# Patient Record
Sex: Female | Born: 1980 | Race: Black or African American | Hispanic: No | Marital: Single | Smoking: Never smoker
Health system: Southern US, Community
[De-identification: ages and names within clinical notes are randomized; demographics above are authoritative.]

## PROBLEM LIST (undated history)

## (undated) DIAGNOSIS — R7303 Prediabetes: Secondary | ICD-10-CM

## (undated) DIAGNOSIS — E559 Vitamin D deficiency, unspecified: Secondary | ICD-10-CM

## (undated) DIAGNOSIS — Z833 Family history of diabetes mellitus: Secondary | ICD-10-CM

## (undated) DIAGNOSIS — Z8639 Personal history of other endocrine, nutritional and metabolic disease: Secondary | ICD-10-CM

## (undated) HISTORY — DX: Personal history of other endocrine, nutritional and metabolic disease: Z86.39

## (undated) HISTORY — DX: Family history of diabetes mellitus: Z83.3

---
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
## 1998-01-11 ENCOUNTER — Other Ambulatory Visit: Admission: RE | Admit: 1998-01-11 | Discharge: 1998-01-11 | Payer: Self-pay | Admitting: Obstetrics and Gynecology

## 2011-09-13 LAB — HM PAP SMEAR: HM Pap smear: NEGATIVE

## 2012-09-17 ENCOUNTER — Encounter: Payer: Self-pay | Admitting: Nurse Practitioner

## 2012-09-18 ENCOUNTER — Ambulatory Visit: Payer: Self-pay | Admitting: Nurse Practitioner

## 2012-09-18 DIAGNOSIS — Z01419 Encounter for gynecological examination (general) (routine) without abnormal findings: Secondary | ICD-10-CM

## 2013-11-15 ENCOUNTER — Emergency Department (HOSPITAL_COMMUNITY): Payer: Managed Care, Other (non HMO)

## 2013-11-15 ENCOUNTER — Emergency Department (HOSPITAL_COMMUNITY)
Admission: EM | Admit: 2013-11-15 | Discharge: 2013-11-15 | Disposition: A | Discharge status: Home / Self Care | Payer: Managed Care, Other (non HMO) | Attending: Emergency Medicine | Admitting: Emergency Medicine

## 2013-11-15 ENCOUNTER — Encounter (HOSPITAL_COMMUNITY): Payer: Self-pay | Admitting: Emergency Medicine

## 2013-11-15 DIAGNOSIS — O9989 Other specified diseases and conditions complicating pregnancy, childbirth and the puerperium: Secondary | ICD-10-CM | POA: Insufficient documentation

## 2013-11-15 DIAGNOSIS — R109 Unspecified abdominal pain: Secondary | ICD-10-CM | POA: Insufficient documentation

## 2013-11-15 DIAGNOSIS — O039 Complete or unspecified spontaneous abortion without complication: Secondary | ICD-10-CM | POA: Insufficient documentation

## 2013-11-15 DIAGNOSIS — R000 Tachycardia, unspecified: Secondary | ICD-10-CM | POA: Insufficient documentation

## 2013-11-15 LAB — CBC
HCT: 37.7 % (ref 36.0–46.0)
Hemoglobin: 12.2 g/dL (ref 12.0–15.0)
MCH: 23.9 pg — ABNORMAL LOW (ref 26.0–34.0)
MCHC: 32.4 g/dL (ref 30.0–36.0)
MCV: 73.8 fL — AB (ref 78.0–100.0)
PLATELETS: 215 10*3/uL (ref 150–400)
RBC: 5.11 MIL/uL (ref 3.87–5.11)
RDW: 13.8 % (ref 11.5–15.5)
WBC: 7.2 10*3/uL (ref 4.0–10.5)

## 2013-11-15 LAB — BASIC METABOLIC PANEL
ANION GAP: 16 — AB (ref 5–15)
BUN: 13 mg/dL (ref 6–23)
CHLORIDE: 101 meq/L (ref 96–112)
CO2: 21 mEq/L (ref 19–32)
Calcium: 9.3 mg/dL (ref 8.4–10.5)
Creatinine, Ser: 0.73 mg/dL (ref 0.50–1.10)
GFR calc Af Amer: >90 mL/min (ref >90)
GFR calc non Af Amer: >90 mL/min (ref >90)
Glucose, Bld: 99 mg/dL (ref 70–99)
Potassium: 3.8 mEq/L (ref 3.7–5.3)
SODIUM: 138 meq/L (ref 137–147)

## 2013-11-15 LAB — ABO/RH: ABO/RH(D): O POS

## 2013-11-15 LAB — HCG, QUANTITATIVE, PREGNANCY: hCG, Beta Chain, Quant, S: 5238 m[IU]/mL — ABNORMAL HIGH (ref <5)

## 2013-11-15 NOTE — Discharge Instructions (Signed)
1. Medications: usual home medications 2. Treatment: rest, drink plenty of fluids,  3. Follow Up: Please followup with your OB/GYN for further evaluation in 1 week as discussed; return to the ED for fevers, worsening bleeding or abd pain or intractable vomiting   Miscarriage A miscarriage is the sudden loss of an unborn baby (fetus) before the 20th week of pregnancy. Most miscarriages happen in the first 3 months of pregnancy. Sometimes, it happens before a woman even knows she is pregnant. A miscarriage is also called a "spontaneous miscarriage" or "early pregnancy loss." Having a miscarriage can be an emotional experience. Talk with your caregiver about any questions you may have about miscarrying, the grieving process, and your future pregnancy plans. CAUSES   Problems with the fetal chromosomes that make it impossible for the baby to develop normally. Problems with the baby's genes or chromosomes are most often the result of errors that occur, by chance, as the embryo divides and grows. The problems are not inherited from the parents.  Infection of the cervix or uterus.   Hormone problems.   Problems with the cervix, such as having an incompetent cervix. This is when the tissue in the cervix is not strong enough to hold the pregnancy.   Problems with the uterus, such as an abnormally shaped uterus, uterine fibroids, or congenital abnormalities.   Certain medical conditions.   Smoking, drinking alcohol, or taking illegal drugs.   Trauma.  Often, the cause of a miscarriage is unknown.  SYMPTOMS   Vaginal bleeding or spotting, with or without cramps or pain.  Pain or cramping in the abdomen or lower back.  Passing fluid, tissue, or blood clots from the vagina. DIAGNOSIS  Your caregiver will perform a physical exam. You may also have an ultrasound to confirm the miscarriage. Blood or urine tests may also be ordered. TREATMENT   Sometimes, treatment is not necessary if you  naturally pass all the fetal tissue that was in the uterus. If some of the fetus or placenta remains in the body (incomplete miscarriage), tissue left behind may become infected and must be removed. Usually, a dilation and curettage (D and C) procedure is performed. During a D and C procedure, the cervix is widened (dilated) and any remaining fetal or placental tissue is gently removed from the uterus.  Antibiotic medicines are prescribed if there is an infection. Other medicines may be given to reduce the size of the uterus (contract) if there is a lot of bleeding.  If you have Rh negative blood and your baby was Rh positive, you will need a Rh immunoglobulin shot. This shot will protect any future baby from having Rh blood problems in future pregnancies. HOME CARE INSTRUCTIONS   Your caregiver may order bed rest or may allow you to continue light activity. Resume activity as directed by your caregiver.  Have someone help with home and family responsibilities during this time.   Keep track of the number of sanitary pads you use each day and how soaked (saturated) they are. Write down this information.   Do not use tampons. Do not douche or have sexual intercourse until approved by your caregiver.   Only take over-the-counter or prescription medicines for pain or discomfort as directed by your caregiver.   Do not take aspirin. Aspirin can cause bleeding.   Keep all follow-up appointments with your caregiver.   If you or your partner have problems with grieving, talk to your caregiver or seek counseling to help cope with  the pregnancy loss. Allow enough time to grieve before trying to get pregnant again.  SEEK IMMEDIATE MEDICAL CARE IF:   You have severe cramps or pain in your back or abdomen.  You have a fever.  You pass large blood clots (walnut-sized or larger) ortissue from your vagina. Save any tissue for your caregiver to inspect.   Your bleeding increases.   You have a  thick, bad-smelling vaginal discharge.  You become lightheaded, weak, or you faint.   You have chills.  MAKE SURE YOU:  Understand these instructions.  Will watch your condition.  Will get help right away if you are not doing well or get worse. Document Released: 10/24/2000 Document Revised: 08/25/2012 Document Reviewed: 06/19/2011 [facility] Patient Information 2015 [place]. This information is not intended to replace advice given to you by your health care provider. Make sure you discuss any questions you have with your health care provider.

## 2013-11-15 NOTE — ED Provider Notes (Signed)
CSN: [id number]     Arrival date & time 11/15/13  1907 History   First MD Initiated Contact with Patient 11/15/13 1936     Chief Complaint  Patient presents with  . Vaginal Bleeding     (Consider location/radiation/quality/duration/timing/severity/associated sxs/prior Treatment) Patient is a [age] y.o. female presenting with vaginal bleeding. The history is provided by the patient and medical records. No language interpreter was used.  Vaginal Bleeding Associated symptoms: abdominal pain   Associated symptoms: no back pain, no dysuria, no fatigue, no fever and no nausea     Sierra [name] is a 33 y.o. female  G3P0020 with no medical problems presents to the Emergency Department complaining of gradual, persistent, progressively worsening lower abd pain onset 6PM.  Pt reports that she took a morning after pill the last week in May, which was taken 48 hours after the sexual encounter. Pt was supposed to have a menses on May 30th.  She reports that she has had spotting for the entire month of June and did not have a full menses at the end of June.  She reports taking a home pregnancy test 3 days ago which was positive.  Pt reports that she began bleeding heavily around 6PM with the abd pain and then in the last several hours has had passage of large lots and questionable tissue.  Abd pain is a 3/10 and located in the lower quadrants. She reports that the initial pain was a 10.  No aggravating of alleviating factors.  Pt denies fever, chills, headache, neck pain, chest pain, SOB, N/V/D, weakness, dizziness, syncope.       No past medical history on file. No past surgical history on file. No family history on file. History  Substance Use Topics  . Smoking status: Not on file  . Smokeless tobacco: Not on file  . Alcohol Use: Not on file   OB History   Grav Para Term Preterm Abortions TAB SAB Ect Mult Living   1              Review of Systems  Constitutional: Negative for fever, diaphoresis,  appetite change, fatigue and unexpected weight change.  HENT: Negative for mouth sores and trouble swallowing.   Respiratory: Negative for cough, chest tightness, shortness of breath, wheezing and stridor.   Cardiovascular: Negative for chest pain and palpitations.  Gastrointestinal: Positive for abdominal pain. Negative for nausea, vomiting, diarrhea, constipation, blood in stool, abdominal distention and rectal pain.  Genitourinary: Positive for vaginal bleeding. Negative for dysuria, urgency, frequency, hematuria, flank pain and difficulty urinating.  Musculoskeletal: Negative for back pain, neck pain and neck stiffness.  Skin: Negative for rash.  Neurological: Negative for weakness.  Hematological: Negative for adenopathy.  Psychiatric/Behavioral: Negative for confusion.  All other systems reviewed and are negative.     Allergies  Review of patient's allergies indicates no known allergies.  Home Medications   Prior to Admission medications   Medication Sig Start Date End Date Taking? Authorizing Provider  Vitamin D, Ergocalciferol, (DRISDOL) 50000 UNITS CAPS capsule Take 50,000 Units by mouth every 7 (seven) days.   Yes Historical Provider, MD   BP 134/84  Pulse 102  Temp(Src) 98.4 F (36.9 C) (Oral)  Resp 18  SpO2 99% Physical Exam  Nursing note and vitals reviewed. Constitutional: She is oriented to person, place, and time. She appears well-developed and well-nourished. No distress.  Awake, alert, nontoxic appearance  HENT:  Head: Normocephalic and atraumatic.  Mouth/Throat: Oropharynx is clear and moist.  No oropharyngeal exudate.  Eyes: Conjunctivae are normal. No scleral icterus.  Neck: Normal range of motion. Neck supple.  Cardiovascular: Normal rate, regular rhythm, normal heart sounds and intact distal pulses.   No murmur heard. Mild tachycardia  Pulmonary/Chest: Effort normal and breath sounds normal. No respiratory distress. She has no wheezes.  Abdominal:  Soft. Bowel sounds are normal. She exhibits no distension and no mass. There is no hepatosplenomegaly. There is no tenderness. There is no rebound, no guarding and no CVA tenderness. Hernia confirmed negative in the right inguinal area and confirmed negative in the left inguinal area.  Abd soft and nontender  Genitourinary: Uterus normal. Pelvic exam was performed with patient supine. There is no rash, tenderness or lesion on the right labia. There is no rash, tenderness or lesion on the left labia. Uterus is not deviated, not enlarged, not fixed and not tender. Cervix exhibits no motion tenderness, no discharge and no friability. Right adnexum displays no mass, no tenderness and no fullness. Left adnexum displays no mass, no tenderness and no fullness. There is bleeding around the vagina. No erythema or tenderness around the vagina. No foreign body around the vagina. No signs of injury around the vagina. No vaginal discharge found.  Large mass of tissue in the vaginal vault removed with ring forceps; moderate bleeding from the cervical os noted  Musculoskeletal: Normal range of motion. She exhibits no edema.  Lymphadenopathy:    She has no cervical adenopathy.       Right: No inguinal adenopathy present.       Left: No inguinal adenopathy present.  Neurological: She is alert and oriented to person, place, and time.  Speech is clear and goal oriented Moves extremities without ataxia  Skin: Skin is warm and dry. No rash noted. She is not diaphoretic. No erythema.  Psychiatric: She has a normal mood and affect.    ED Course  Procedures (including critical care time) Labs Review Labs Reviewed  CBC - Abnormal; Notable for the following:    MCV 73.8 (*)    MCH 23.9 (*)    All other components within normal limits  BASIC METABOLIC PANEL - Abnormal; Notable for the following:    Anion gap 16 (*)    All other components within normal limits  HCG, QUANTITATIVE, PREGNANCY - Abnormal; Notable for the  following:    hCG, Beta Chain, Quant, S 5238 (*)    All other components within normal limits  POC URINE PREG, ED  ABO/RH    Imaging Review US Ob Comp Less 14 Wks  11/15/2013   CLINICAL DATA:  Vaginal bleeding.  EXAM: OBSTETRIC <14 WK [place] AND TRANSVAGINAL OB US  TECHNIQUE: Both transabdominal and transvaginal ultrasound examinations were performed for complete evaluation of the gestation as well as the maternal uterus, adnexal regions, and pelvic cul-de-sac. Transvaginal technique was performed to assess early pregnancy.  COMPARISON:  None.  FINDINGS: Intrauterine gestational sac: Absent  Yolk sac:  Not visualized  Embryo:  Not visualized  Maternal uterus/adnexae: Endometrium is thickened and heterogeneous, measuring up to 20 mm in thickness. This could represent blood products within the endometrium. No adnexal masses. Ovaries symmetric in size and echotexture. No free fluid.  IMPRESSION: No intrauterine pregnancy visualized. Differential considerations would include intrauterine pregnancy too early to visualize, spontaneous abortion, or occult ectopic pregnancy. Recommend close clinical followup and serial quantitative beta HCGs and ultrasounds.   Electronically Signed   By: Charlett Nose M.D.   On: 11/15/2013 21:29  [place] Ob Transvaginal  11/15/2013   CLINICAL DATA:  Vaginal bleeding.  EXAM: OBSTETRIC <14 WK US AND TRANSVAGINAL OB US  TECHNIQUE: Both transabdominal and transvaginal ultrasound examinations were performed for complete evaluation of the gestation as well as the maternal uterus, adnexal regions, and pelvic cul-de-sac. Transvaginal technique was performed to assess early pregnancy.  COMPARISON:  None.  FINDINGS: Intrauterine gestational sac: Absent  Yolk sac:  Not visualized  Embryo:  Not visualized  Maternal uterus/adnexae: Endometrium is thickened and heterogeneous, measuring up to 20 mm in thickness. This could represent blood products within the endometrium. No adnexal masses. Ovaries  symmetric in size and echotexture. No free fluid.  IMPRESSION: No intrauterine pregnancy visualized. Differential considerations would include intrauterine pregnancy too early to visualize, spontaneous abortion, or occult ectopic pregnancy. Recommend close clinical followup and serial quantitative beta HCGs and ultrasounds.   Electronically Signed   By: Charlett NoseKevin  Dover M.D.   On: 11/15/2013 21:29     EKG Interpretation None      MDM   Final diagnoses:  Spontaneous miscarriage   Sierra [name] presents with lower abdominal cramping, vaginal bleeding and passage of tissue from the vaginal vault onset approximately 6 PM tonight.  Patient with positive home pregnancy test. Suspect spontaneous miscarriage. Patient is alert, oriented, nontoxic and nonseptic appearing. Will obtain the labs, vitals and ultrasound to rule out ectopic pregnancy. She declines any pain medication at this time.  10:15 PM Patient pain under control. She continues to decline pain medication.  Patient without anemia. hCG greater than 5000.  Ultrasound without intrauterine pregnancy but no evidence of ectopic pregnancy. Patient without adnexal tenderness or mass on pelvic exam.  Large specimen of tissue removed from vaginal vault.    Patient without anemia and stable vital signs at this time. Catheterized urine, wet prep and GC chlamydia collected, but there was a labeling error with the lab.  Discussed with the patient reasons to look for urinary tract infection, STD and need to recollect samples however I do not believe that this will change her management tonight.  Patient declines recollection of samples and agrees to f/u with her OB/GYN.  I am comfortable without recollecting samples as she had no c/o dysuria, or vaginal discharge prior to today's episode of bleeding.  I have personally reviewed patient's vitals, nursing note and any pertinent labs or imaging.  I performed an undressed physical exam.    At this time, it has been  determined that no acute conditions requiring further emergency intervention. The patient/guardian have been advised of the diagnosis and plan. I reviewed all labs and imaging including any potential incidental findings. We have discussed signs and symptoms that warrant return to the ED, such as fevers, worsening bleeding or abd pain or intractable vomiting.  Patient/guardian has voiced understanding and agreed to follow-up with the PCP or specialist in 1 week.  Vital signs are stable at discharge. Pt initially tachycardic on exam with resolution on repeat physical exam.         Dierdre ForthHannah Amaranta Mehl, PA-C 11/15/13 2322

## 2013-11-15 NOTE — ED Notes (Signed)
Pt arrived to the Ed with a complaint of vaginal bleeding. The patient believes she has had a miscarriage.  Pt took a pregnacy test a couple of weeks ago and it was positive.  Today approx. 30 prior to arrival the patient had a bleeding episode.  Pt states she has used 2 pads so far.

## 2013-11-15 NOTE — ED Provider Notes (Signed)
Medical screening examination/treatment/procedure(s) were performed by non-physician practitioner and as supervising physician I was immediately available for consultation/collaboration.   EKG Interpretation None       Weslie Pretlow K Linker, MD 11/15/13 2331 

## 2013-11-24 ENCOUNTER — Ambulatory Visit (INDEPENDENT_AMBULATORY_CARE_PROVIDER_SITE_OTHER): Payer: Managed Care, Other (non HMO) | Admitting: Nurse Practitioner

## 2013-11-24 ENCOUNTER — Encounter: Payer: Self-pay | Admitting: Nurse Practitioner

## 2013-11-24 VITALS — BP 120/70 | HR 92 | Ht 71.25 in | Wt 258.0 lb

## 2013-11-24 DIAGNOSIS — Z01419 Encounter for gynecological examination (general) (routine) without abnormal findings: Secondary | ICD-10-CM

## 2013-11-24 DIAGNOSIS — Z0000 Encounter for general adult medical examination without abnormal findings: Secondary | ICD-10-CM

## 2013-11-24 DIAGNOSIS — Z8742 Personal history of other diseases of the female genital tract: Secondary | ICD-10-CM

## 2013-11-24 DIAGNOSIS — Z8759 Personal history of other complications of pregnancy, childbirth and the puerperium: Secondary | ICD-10-CM

## 2013-11-24 LAB — POCT URINALYSIS DIPSTICK
Bilirubin, UA: NEGATIVE
Blood, UA: NEGATIVE
Glucose, UA: NEGATIVE
Ketones, UA: NEGATIVE
Leukocytes, UA: NEGATIVE
NITRITE UA: NEGATIVE
PH UA: 7
Protein, UA: NEGATIVE
UROBILINOGEN UA: NEGATIVE

## 2013-11-24 MED ORDER — NORETHIN ACE-ETH ESTRAD-FE 1-20 MG-MCG PO TABS: 1.0000 | ORAL_TABLET | Freq: Every day | ORAL | Status: DC

## 2013-11-24 NOTE — Progress Notes (Signed)
Patient ID: Sierra [name], female   DOB: 26-Mar-1981, 33 y.o.   MRN: [id number] 33 y.o. G3P0030 Single African American Fe here for annual exam.  LMP menses in April was normal.. Then no menses in May but took morning after pill after condom failure.  Then on June 7 th started bleeding / spotting the entire month through to July. Then on July 5 th had lower abdominal cramping and passing tissue.  [facility] for [facility] miscarriage. Her labs show a serum HCG of 5238 and CBC with low HCG and MCH.  She had PUS which showed no intrauterine pregnancy or gestational sac.  She now comes to restart on OCP.and lab follow up.  She denies any further cramps.  No breast tenderness.  She has stopped spotting yesterday.  Will need repeat of labs today.   Same partner since 2008.  This is her second pregnancy - first one ended with abortion.  Patient's last menstrual period was 09/09/2013.          Sexually active: Yes.    The current method of family planning is condoms most of the time.    Exercising: No.  The patient does not participate in regular exercise at present. Smoker:  no  Health Maintenance: Pap:  09/13/11, WNL, neg HR HPV TDaP:  2008 Labs: HB:  12.9  Urine:  Negative    reports that she has never smoked. She has never used smokeless tobacco. She reports that she drinks about 1.5 ounces of alcohol per week. She reports that she does not use illicit drugs.  History reviewed. No pertinent past medical history.  History reviewed. No pertinent past surgical history.  Current Outpatient Prescriptions  Medication Sig Dispense Refill  . Vitamin D, Ergocalciferol, (DRISDOL) 50000 UNITS CAPS capsule Take 50,000 Units by mouth every 7 (seven) days.      . norethindrone-ethinyl estradiol (JUNEL FE,GILDESS FE,LOESTRIN FE) 1-20 MG-MCG tablet Take 1 tablet by mouth daily.  3 Package  3   No current facility-administered medications for this visit.    Family History  Problem Relation Age of  Onset  . Diabetes Father   . Cancer Maternal Grandmother 2266    ? related to smoking, unsure type    ROS:  Pertinent items are noted in HPI.  Otherwise, a comprehensive ROS was negative.  Exam:   BP 120/70  Pulse 92  Ht 5' 11.25" (1.81 m)  Wt 258 lb (117.028 kg)  BMI 35.72 kg/m2  LMP 09/09/2013  Breastfeeding? No Height: 5' 11.25" (181 cm)  Ht Readings from Last 3 Encounters:  11/24/13 5' 11.25" (1.81 m)    General appearance: alert, cooperative and appears stated age Head: Normocephalic, without obvious abnormality, atraumatic Neck: no adenopathy, supple, symmetrical, trachea midline and thyroid normal to inspection and palpation Lungs: clear to auscultation bilaterally Breasts: normal appearance, no masses or tenderness Heart: regular rate and rhythm Abdomen: soft, non-tender; no masses,  no organomegaly Extremities: extremities normal, atraumatic, no cyanosis or edema Skin: Skin color, texture, turgor normal. No rashes or lesions Lymph nodes: Cervical, supraclavicular, and axillary nodes normal. No abnormal inguinal nodes palpated Neurologic: Grossly normal   Pelvic: External genitalia:  no lesions              Urethra:  normal appearing urethra with no masses, tenderness or lesions              Bartholin's and Skene's: normal  Vagina: normal appearing vagina with normal color and discharge, no lesions              Cervix: anteverted cervical os is closed              Pap taken: No. Bimanual Exam:  Uterus:  normal with upper normal in size, no pain with exam.              Adnexa: no mass, fullness, tenderness               Rectovaginal: Confirms               Anus:  normal sphincter tone, no lesions  A:  Well Woman with normal exam  Miscarriage on 11/15/13 - follow up is needed  Contraceptive needs  History of Vit D deficiency     P:   Reviewed health and wellness pertinent to exam  Pap smear not taken today  Repeat serum HCG and CBC - informed that  serial HCG will need to be done until normal  RX Loestrin 1/20 Fe RX for a year - counseled on potential risk and side effects.  She will abstain from SA for at least 2 weeks and use BUM of birth control for a month.  Counseled on breast self exam, STD prevention, HIV risk factors and prevention, use and side effects of OCP's, adequate intake of calcium and vitamin D, diet and exercise return annually or prn  An After Visit Summary was printed and given to the patient.

## 2013-11-24 NOTE — Patient Instructions (Addendum)
EXERCISE AND DIET:  We recommended that you start or continue a regular exercise program for good health. Regular exercise means any activity that makes your heart beat faster and makes you sweat.  We recommend exercising at least 30 minutes per day at least 3 days a week, preferably 4 or 5.  We also recommend a diet low in fat and sugar.  Inactivity, poor dietary choices and obesity can cause diabetes, heart attack, stroke, and kidney damage, among others.    ALCOHOL AND SMOKING:  Women should limit their alcohol intake to no more than 7 drinks/beers/glasses of wine (combined, not each!) per week. Moderation of alcohol intake to this level decreases your risk of breast cancer and liver damage. And of course, no recreational drugs are part of a healthy lifestyle.  And absolutely no smoking or even second hand smoke. Most people know smoking can cause heart and lung diseases, but did you know it also contributes to weakening of your bones? Aging of your skin?  Yellowing of your teeth and nails?  CALCIUM AND VITAMIN D:  Adequate intake of calcium and Vitamin D are recommended.  The recommendations for exact amounts of these supplements seem to change often, but generally speaking 600 mg of calcium (either carbonate or citrate) and 800 units of Vitamin D per day seems prudent. Certain women may benefit from higher intake of Vitamin D.  If you are among these women, your doctor will have told you during your visit.    PAP SMEARS:  Pap smears, to check for cervical cancer or precancers,  have traditionally been done yearly, although recent scientific advances have shown that most women can have pap smears less often.  However, every woman still should have a physical exam from her gynecologist every year. It will include a breast check, inspection of the vulva and vagina to check for abnormal growths or skin changes, a visual exam of the cervix, and then an exam to evaluate the size and shape of the uterus and  ovaries.  And after 35 years of age, a rectal exam is indicated to check for rectal cancers. We will also provide age appropriate advice regarding health maintenance, like when you should have certain vaccines, screening for sexually transmitted diseases, bone density testing, colonoscopy, mammograms, etc.   MAMMOGRAMS:  All women over 40 years old should have a yearly mammogram. Many facilities now offer a "3D" mammogram, which may cost around $50 extra out of pocket. If possible,  we recommend you accept the option to have the 3D mammogram performed.  It both reduces the number of women who will be called back for extra views which then turn out to be normal, and it is better than the routine mammogram at detecting truly abnormal areas.    COLONOSCOPY:  Colonoscopy to screen for colon cancer is recommended for all women at age 50.  We know, you hate the idea of the prep.  We agree, BUT, having colon cancer and not knowing it is worse!!  Colon cancer so often starts as a polyp that can be seen and removed at colonscopy, which can quite literally save your life!  And if your first colonoscopy is normal and you have no family history of colon cancer, most women don't have to have it again for 10 years.  Once every ten years, you can do something that may end up saving your life, right?  We will be happy to help you get it scheduled when you are ready.    Be sure to check your insurance coverage so you understand how much it will cost.  It may be covered as a preventative service at no cost, but you should check your particular policy.      Miscarriage A miscarriage is the sudden loss of an unborn baby (fetus) before the 20th week of pregnancy. Most miscarriages happen in the first 3 months of pregnancy. Sometimes, it happens before a woman even knows she is pregnant. A miscarriage is also called a "spontaneous miscarriage" or "early pregnancy loss." Having a miscarriage can be an emotional experience. Talk  with your caregiver about any questions you may have about miscarrying, the grieving process, and your future pregnancy plans. CAUSES   Problems with the fetal chromosomes that make it impossible for the baby to develop normally. Problems with the baby's genes or chromosomes are most often the result of errors that occur, by chance, as the embryo divides and grows. The problems are not inherited from the parents.  Infection of the cervix or uterus.   Hormone problems.   Problems with the cervix, such as having an incompetent cervix. This is when the tissue in the cervix is not strong enough to hold the pregnancy.   Problems with the uterus, such as an abnormally shaped uterus, uterine fibroids, or congenital abnormalities.   Certain medical conditions.   Smoking, drinking alcohol, or taking illegal drugs.   Trauma.  Often, the cause of a miscarriage is unknown.  SYMPTOMS   Vaginal bleeding or spotting, with or without cramps or pain.  Pain or cramping in the abdomen or lower back.  Passing fluid, tissue, or blood clots from the vagina. DIAGNOSIS  Your caregiver will perform a physical exam. You may also have an ultrasound to confirm the miscarriage. Blood or urine tests may also be ordered. TREATMENT   Sometimes, treatment is not necessary if you naturally pass all the fetal tissue that was in the uterus. If some of the fetus or placenta remains in the body (incomplete miscarriage), tissue left behind may become infected and must be removed. Usually, a dilation and curettage (D and C) procedure is performed. During a D and C procedure, the cervix is widened (dilated) and any remaining fetal or placental tissue is gently removed from the uterus.  Antibiotic medicines are prescribed if there is an infection. Other medicines may be given to reduce the size of the uterus (contract) if there is a lot of bleeding.  If you have Rh negative blood and your baby was Rh positive, you  will need a Rh immunoglobulin shot. This shot will protect any future baby from having Rh blood problems in future pregnancies. HOME CARE INSTRUCTIONS   Your caregiver may order bed rest or may allow you to continue light activity. Resume activity as directed by your caregiver.  Have someone help with home and family responsibilities during this time.   Keep track of the number of sanitary pads you use each day and how soaked (saturated) they are. Write down this information.   Do not use tampons. Do not douche or have sexual intercourse until approved by your caregiver.   Only take over-the-counter or prescription medicines for pain or discomfort as directed by your caregiver.   Do not take aspirin. Aspirin can cause bleeding.   Keep all follow-up appointments with your caregiver.   If you or your partner have problems with grieving, talk to your caregiver or seek counseling to help cope with the pregnancy loss. Allow enough  time to grieve before trying to get pregnant again.  SEEK IMMEDIATE MEDICAL CARE IF:   You have severe cramps or pain in your back or abdomen.  You have a fever.  You pass large blood clots (walnut-sized or larger) ortissue from your vagina. Save any tissue for your caregiver to inspect.   Your bleeding increases.   You have a thick, bad-smelling vaginal discharge.  You become lightheaded, weak, or you faint.   You have chills.  MAKE SURE YOU:  Understand these instructions.  Will watch your condition.  Will get help right away if you are not doing well or get worse. Document Released: 10/24/2000 Document Revised: 08/25/2012 Document Reviewed: 06/19/2011 [facility] Patient Information 2015 [employer], [place]. This information is not intended to replace advice given to you by your health care provider. Make sure you discuss any questions you have with your health care provider.

## 2013-11-25 LAB — CBC WITH DIFFERENTIAL/PLATELET
BASOS ABS: 0 10*3/uL (ref 0.0–0.1)
BASOS PCT: 0 % (ref 0–1)
EOS ABS: 0.1 10*3/uL (ref 0.0–0.7)
Eosinophils Relative: 1 % (ref 0–5)
HCT: 37.3 % (ref 36.0–46.0)
Hemoglobin: 12.6 g/dL (ref 12.0–15.0)
Lymphocytes Relative: 57 % — ABNORMAL HIGH (ref 12–46)
Lymphs Abs: 3.9 10*3/uL (ref 0.7–4.0)
MCH: 24.1 pg — AB (ref 26.0–34.0)
MCHC: 33.8 g/dL (ref 30.0–36.0)
MCV: 71.5 fL — ABNORMAL LOW (ref 78.0–100.0)
MONOS PCT: 7 % (ref 3–12)
Monocytes Absolute: 0.5 10*3/uL (ref 0.1–1.0)
Neutro Abs: 2.4 10*3/uL (ref 1.7–7.7)
Neutrophils Relative %: 35 % — ABNORMAL LOW (ref 43–77)
Platelets: 258 10*3/uL (ref 150–400)
RBC: 5.22 MIL/uL — ABNORMAL HIGH (ref 3.87–5.11)
RDW: 14.9 % (ref 11.5–15.5)
WBC: 6.8 10*3/uL (ref 4.0–10.5)

## 2013-11-25 LAB — HCG, QUANTITATIVE, PREGNANCY: hCG, Beta Chain, Quant, S: 26 m[IU]/mL

## 2013-11-25 LAB — HEMOGLOBIN, FINGERSTICK: HEMOGLOBIN, FINGERSTICK: 12.9 g/dL (ref 12.0–16.0)

## 2013-11-26 ENCOUNTER — Other Ambulatory Visit: Payer: Self-pay

## 2013-11-30 NOTE — Progress Notes (Signed)
Note reviewed, agree with plan.  Giani Winther, MD  

## 2014-03-15 ENCOUNTER — Encounter: Payer: Self-pay | Admitting: Nurse Practitioner

## 2014-10-12 ENCOUNTER — Other Ambulatory Visit: Payer: Self-pay | Admitting: Nurse Practitioner

## 2014-10-12 MED ORDER — NORETHIN ACE-ETH ESTRAD-FE 1-20 MG-MCG PO TABS: 1.0000 | ORAL_TABLET | Freq: Every day | ORAL | Status: DC

## 2014-10-12 NOTE — Telephone Encounter (Signed)
Medication refill request: Junel  Last AEX:  11/24/13 PG Next AEX: 11/26/14 PG Last MMG (if hormonal medication request):  Refill authorized: 11/24/13 #3packs w/ 3R. Today #1pack/0R?

## 2014-10-12 NOTE — Telephone Encounter (Signed)
Patient request refill on Junel until her annual exam in July 2016.

## 2014-11-18 ENCOUNTER — Other Ambulatory Visit: Payer: Self-pay | Admitting: Nurse Practitioner

## 2014-11-18 MED ORDER — NORETHIN ACE-ETH ESTRAD-FE 1-20 MG-MCG PO TABS: 1.0000 | ORAL_TABLET | Freq: Every day | ORAL | Status: DC

## 2014-11-18 NOTE — Telephone Encounter (Signed)
Patient calling requesting a refill on her birth control. Pharmacy on file is correct. °

## 2014-11-18 NOTE — Telephone Encounter (Signed)
Medication refill request: Loestrin 1/20 mcg Last AEX:  11/24/13 with PG  Next AEX: 11/26/14 with PG Last MMG (if hormonal medication request): n/a Refill authorized: #1 pack no rfs  (routed to DL since PG is out of office today)

## 2014-11-19 MED ORDER — NORETHIN ACE-ETH ESTRAD-FE 1-20 MG-MCG PO TABS: 1.0000 | ORAL_TABLET | Freq: Every day | ORAL | Status: DC

## 2014-11-19 NOTE — Telephone Encounter (Signed)
Called patient and notified her of rx being sent in to pharmacy, patient asked to reschedule her AEX for 11/30/14. Appointment scheduled for 11/30/14 at 3:00.

## 2014-11-19 NOTE — Addendum Note (Signed)
Addended by: Lorraine LaxSHAW, Tashaya Ancrum J on: 11/19/2014 12:38 PM   Modules accepted: Orders

## 2014-11-19 NOTE — Telephone Encounter (Signed)
Received [facility] from [facility] patient needs 90 day supply due to insurance okay to send in 3 months?

## 2014-11-19 NOTE — Telephone Encounter (Signed)
Loestrin 1/20 mcg sent for 90 day supply.

## 2014-11-19 NOTE — Telephone Encounter (Signed)
Ok to fill 90 days

## 2014-11-26 ENCOUNTER — Ambulatory Visit: Payer: Managed Care, Other (non HMO) | Admitting: Nurse Practitioner

## 2014-11-30 ENCOUNTER — Encounter: Payer: Self-pay | Admitting: Nurse Practitioner

## 2014-11-30 ENCOUNTER — Ambulatory Visit (INDEPENDENT_AMBULATORY_CARE_PROVIDER_SITE_OTHER): Payer: Managed Care, Other (non HMO) | Admitting: Nurse Practitioner

## 2014-11-30 VITALS — BP 120/76 | HR 96 | Ht 71.0 in | Wt 279.0 lb

## 2014-11-30 DIAGNOSIS — Z01419 Encounter for gynecological examination (general) (routine) without abnormal findings: Secondary | ICD-10-CM

## 2014-11-30 DIAGNOSIS — R8290 Unspecified abnormal findings in urine: Secondary | ICD-10-CM | POA: Diagnosis not present

## 2014-11-30 DIAGNOSIS — Z0000 Encounter for general adult medical examination without abnormal findings: Secondary | ICD-10-CM | POA: Diagnosis not present

## 2014-11-30 MED ORDER — NORETHIN ACE-ETH ESTRAD-FE 1-20 MG-MCG PO TABS
1.0000 | ORAL_TABLET | Freq: Every day | ORAL | Status: DC
Start: 2014-11-30 — End: 2015-12-19

## 2014-11-30 NOTE — Patient Instructions (Signed)

## 2014-11-30 NOTE — Progress Notes (Signed)
Patient ID: Sierra [name], female   DOB: 12/01/1980, 34 y.o.   MRN: [id number] [age] y.o. [id number] Single African American Fe here for annual exam.  Menses usually 5 days , this last one was 3 days.  Patient's last menstrual period was 11/03/2014 (exact date).          Sexually active: Yes.    The current method of family planning is OCP (estrogen/progesterone).    Exercising:  Pt is trying to get to gym.   Smoker:  no  Health Maintenance: Pap: 09/13/11, Negative with neg HR HPV TDaP: 2008 Labs:  HB:  Declined  Urine:  Trace RBC, trace leuk's   reports that she has never smoked. She has never used smokeless tobacco. She reports that she drinks about 1.5 oz of alcohol per week. She reports that she does not use illicit drugs.  History reviewed. No pertinent past medical history.  History reviewed. No pertinent past surgical history.  Current Outpatient Prescriptions  Medication Sig Dispense Refill  . norethindrone-ethinyl estradiol (JUNEL FE,GILDESS FE,LOESTRIN FE) 1-20 MG-MCG tablet Take 1 tablet by mouth daily. 3 Package 0   No current facility-administered medications for this visit.    Family History  Problem Relation Age of Onset  . Diabetes Father   . Cancer Maternal Grandmother 2066    ? related to smoking, unsure type    ROS:  Pertinent items are noted in HPI.  Otherwise, a comprehensive ROS was negative.  Exam:   BP 120/76 mmHg  Pulse 96  Ht 5\' 11"  (1.803 m)  Wt 279 lb (126.554 kg)  BMI 38.93 kg/m2  LMP 11/03/2014 (Exact Date) Height: 5\' 11"  (180.3 cm) Ht Readings from Last 3 Encounters:  11/30/14 5\' 11"  (1.803 m)  11/24/13 5' 11.25" (1.81 m)    General appearance: alert, cooperative and appears stated age Head: Normocephalic, without obvious abnormality, atraumatic Neck: no adenopathy, supple, symmetrical, trachea midline and thyroid normal to inspection and palpation Lungs: clear to auscultation bilaterally Breasts: normal appearance, no masses or  tenderness Heart: regular rate and rhythm Abdomen: soft, non-tender; no masses,  no organomegaly Extremities: extremities normal, atraumatic, no cyanosis or edema Skin: Skin color, texture, turgor normal. No rashes or lesions Lymph nodes: Cervical, supraclavicular, and axillary nodes normal. No abnormal inguinal nodes palpated Neurologic: Grossly normal   Pelvic: External genitalia:  no lesions              Urethra:  normal appearing urethra with no masses, tenderness or lesions              Bartholin's and Skene's: normal                 Vagina: normal appearing vagina with normal color and discharge, no lesions              Cervix: anteverted              Pap taken: Yes.   Bimanual Exam:  Uterus:  normal size, contour, position, consistency, mobility, non-tender              Adnexa: no mass, fullness, tenderness               Rectovaginal: Confirms               Anus:  normal sphincter tone, no lesions  Chaperone present:  yes  A:  Well Woman with normal exam  Miscarriage on 11/15/13 Contraceptive now with OCP History of Vit D deficiency  R/O UTI -  no symptoms   P:   Reviewed health and wellness pertinent to exam  Pap smear as above  Refill on Junel Fe 1/20 for a year  Follow with urine C& S  Counseled on breast self exam, STD prevention, use and side effects of OCP's, adequate intake of calcium and vitamin D, diet and exercise return annually or prn  An After Visit Summary was printed and given to the patient.

## 2014-12-01 NOTE — Progress Notes (Signed)
Encounter reviewed by Dr. Citlalli Weikel Amundson C. Silva.  

## 2014-12-02 LAB — URINE CULTURE
Colony Count: NO GROWTH
ORGANISM ID, BACTERIA: NO GROWTH

## 2014-12-02 LAB — IPS PAP TEST WITH HPV

## 2015-10-25 ENCOUNTER — Emergency Department (HOSPITAL_COMMUNITY)
Admission: EM | Admit: 2015-10-25 | Discharge: 2015-10-25 | Discharge status: Absent without leave | Payer: Managed Care, Other (non HMO)

## 2015-10-25 ENCOUNTER — Encounter: Payer: Self-pay | Admitting: Nurse Practitioner

## 2015-10-25 ENCOUNTER — Ambulatory Visit (INDEPENDENT_AMBULATORY_CARE_PROVIDER_SITE_OTHER): Payer: Managed Care, Other (non HMO) | Admitting: Nurse Practitioner

## 2015-10-25 VITALS — BP 118/64 | HR 64 | Temp 98.6°F | Resp 18 | Ht 71.0 in | Wt 287.0 lb

## 2015-10-25 DIAGNOSIS — Z206 Contact with and (suspected) exposure to human immunodeficiency virus [HIV]: Secondary | ICD-10-CM
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
## 2015-10-27 ENCOUNTER — Encounter: Payer: Self-pay | Admitting: Nurse Practitioner

## 2015-10-29 NOTE — Progress Notes (Signed)
Encounter reviewed by Dr. Janean SarkBrook Amundson C. Silva. I do not seen an encounter that the patient was actually seen in the ER for PEP.  Her exposure was greater than 72 hours prior.  Her theoretic risk of HIV transmission is low. She needs baseline HIV testing, repeat at 6 weeks and then again at 4 months post exposure.  Condom use is appropriate at this time.  Patient will be contacted in follow up.

## 2015-10-31 ENCOUNTER — Telehealth: Payer: Self-pay | Admitting: Nurse Practitioner

## 2015-10-31 NOTE — Telephone Encounter (Signed)
Pt is called about the need to get HIV testing.  She is noted to go have gone to ED but no outcome.  She states after going to the ED, she was informed that the cost to her would be a lot of money and [facility] [address]  They did the HIV.  She is to repeat in 6 wk's, 3 month, and 6 months.

## 2015-12-19 ENCOUNTER — Ambulatory Visit (INDEPENDENT_AMBULATORY_CARE_PROVIDER_SITE_OTHER): Payer: Managed Care, Other (non HMO) | Admitting: Nurse Practitioner

## 2015-12-19 ENCOUNTER — Encounter: Payer: Self-pay | Admitting: Nurse Practitioner

## 2015-12-19 VITALS — BP 122/80 | HR 64 | Ht 71.25 in | Wt 281.0 lb

## 2015-12-19 DIAGNOSIS — Z0000 Encounter for general adult medical examination without abnormal findings: Secondary | ICD-10-CM

## 2015-12-19 DIAGNOSIS — R8290 Unspecified abnormal findings in urine: Secondary | ICD-10-CM

## 2015-12-19 DIAGNOSIS — E559 Vitamin D deficiency, unspecified: Secondary | ICD-10-CM

## 2015-12-19 DIAGNOSIS — Z01419 Encounter for gynecological examination (general) (routine) without abnormal findings: Secondary | ICD-10-CM

## 2015-12-19 LAB — POCT URINALYSIS DIPSTICK
BILIRUBIN UA: NEGATIVE
Glucose, UA: NEGATIVE
KETONES UA: NEGATIVE
LEUKOCYTES UA: NEGATIVE
Nitrite, UA: NEGATIVE
PH UA: 5
Protein, UA: NEGATIVE
Urobilinogen, UA: NEGATIVE

## 2015-12-19 LAB — CBC WITH DIFFERENTIAL/PLATELET
BASOS PCT: 0 %
Basophils Absolute: 0 cells/uL (ref 0–200)
EOS PCT: 2 %
Eosinophils Absolute: 124 cells/uL (ref 15–500)
HCT: 40.4 % (ref 35.0–45.0)
HEMOGLOBIN: 12.5 g/dL (ref 11.7–15.5)
LYMPHS ABS: 3100 {cells}/uL (ref 850–3900)
Lymphocytes Relative: 50 %
MCH: 23.1 pg — ABNORMAL LOW (ref 27.0–33.0)
MCHC: 30.9 g/dL — ABNORMAL LOW (ref 32.0–36.0)
MCV: 74.7 fL — ABNORMAL LOW (ref 80.0–100.0)
MPV: 10.7 fL (ref 7.5–12.5)
Monocytes Absolute: 558 cells/uL (ref 200–950)
Monocytes Relative: 9 %
NEUTROS ABS: 2418 {cells}/uL (ref 1500–7800)
Neutrophils Relative %: 39 %
PLATELETS: 246 10*3/uL (ref 140–400)
RBC: 5.41 MIL/uL — AB (ref 3.80–5.10)
RDW: 15.7 % — AB (ref 11.0–15.0)
WBC: 6.2 10*3/uL (ref 3.8–10.8)

## 2015-12-19 LAB — HEMOGLOBIN, FINGERSTICK: Hemoglobin, fingerstick: 12.7 g/dL (ref 12.0–16.0)

## 2015-12-19 MED ORDER — NORETHIN ACE-ETH ESTRAD-FE 1-20 MG-MCG PO TABS: 1.0000 | ORAL_TABLET | Freq: Every day | ORAL | 4 refills | Status: DC

## 2015-12-19 NOTE — Patient Instructions (Signed)
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
## 2015-12-19 NOTE — Progress Notes (Signed)
Reviewed personally.  M. Suzanne Coulton Schlink, MD.  

## 2015-12-20 LAB — COMPREHENSIVE METABOLIC PANEL
ALK PHOS: 60 U/L (ref 33–115)
ALT: 23 U/L (ref 6–29)
AST: 16 U/L (ref 10–30)
Albumin: 4.2 g/dL (ref 3.6–5.1)
BUN: 12 mg/dL (ref 7–25)
CALCIUM: 9.3 mg/dL (ref 8.6–10.2)
CO2: 21 mmol/L (ref 20–31)
Chloride: 105 mmol/L (ref 98–110)
Creat: 0.67 mg/dL (ref 0.50–1.10)
Glucose, Bld: 102 mg/dL — ABNORMAL HIGH (ref 65–99)
POTASSIUM: 4.3 mmol/L (ref 3.5–5.3)
Sodium: 138 mmol/L (ref 135–146)
TOTAL PROTEIN: 6.6 g/dL (ref 6.1–8.1)
Total Bilirubin: 0.3 mg/dL (ref 0.2–1.2)

## 2015-12-20 LAB — LIPID PANEL
CHOL/HDL RATIO: 2.3 ratio (ref <=5.0)
CHOLESTEROL: 118 mg/dL — AB (ref 125–200)
HDL: 51 mg/dL (ref >=46)
LDL CALC: 49 mg/dL (ref <130)
TRIGLYCERIDES: 88 mg/dL (ref <150)
VLDL: 18 mg/dL (ref <30)

## 2015-12-20 LAB — HEMOGLOBIN A1C
Hgb A1c MFr Bld: 5.5 % (ref <5.7)
MEAN PLASMA GLUCOSE: 111 mg/dL

## 2015-12-20 LAB — URINALYSIS, MICROSCOPIC ONLY
BACTERIA UA: NONE SEEN [HPF]
Casts: NONE SEEN [LPF]
Crystals: NONE SEEN [HPF]
RBC / HPF: NONE SEEN RBC/HPF (ref <=2)
Squamous Epithelial / LPF: NONE SEEN [HPF] (ref <=5)
WBC UA: NONE SEEN WBC/HPF (ref <=5)
Yeast: NONE SEEN [HPF]

## 2015-12-20 LAB — VITAMIN D 25 HYDROXY (VIT D DEFICIENCY, FRACTURES): Vit D, 25-Hydroxy: 11 ng/mL — ABNORMAL LOW (ref 30–100)

## 2015-12-20 LAB — HIV ANTIBODY (ROUTINE TESTING W REFLEX): HIV: NONREACTIVE

## 2015-12-20 LAB — TSH: TSH: 1.34 mIU/L

## 2015-12-21 MED ORDER — VITAMIN D (ERGOCALCIFEROL) 1.25 MG (50000 UNIT) PO CAPS: 50000.0000 [IU] | ORAL_CAPSULE | ORAL | 1 refills | Status: DC

## 2015-12-21 NOTE — Addendum Note (Signed)
Addended by: Francee PiccoloPHILLIPS, STEPHANIE C on: 12/21/2015 10:37 AM   Modules accepted: Orders

## 2016-01-16 ENCOUNTER — Other Ambulatory Visit: Payer: Self-pay | Admitting: Nurse Practitioner

## 2016-01-17 NOTE — Telephone Encounter (Signed)
Medication refill request: OCP Last AEX:  12/19/15 PG Next AEX: 12/19/16 PG Last MMG (if hormonal medication request): None Refill authorized: 12/19/15 [facility]3packs/[facility]R [facility] [place].   Patient notified Rx has been sent to pharmacy.

## 2016-01-17 NOTE — Telephone Encounter (Signed)
Patient is requesting a refill for her birth control. °

## 2016-04-23 ENCOUNTER — Other Ambulatory Visit (INDEPENDENT_AMBULATORY_CARE_PROVIDER_SITE_OTHER): Payer: Managed Care, Other (non HMO)

## 2016-04-23 DIAGNOSIS — Z0000 Encounter for general adult medical examination without abnormal findings: Secondary | ICD-10-CM

## 2016-04-23 DIAGNOSIS — E559 Vitamin D deficiency, unspecified: Secondary | ICD-10-CM

## 2016-04-23 NOTE — Addendum Note (Signed)
Addended by: Francee PiccoloPHILLIPS, Theodoro Koval C on: 04/23/2016 01:40 PM   Modules accepted: Orders

## 2016-04-24 LAB — HIV ANTIBODY (ROUTINE TESTING W REFLEX): HIV 1&2 Ab, 4th Generation: NONREACTIVE

## 2016-04-24 LAB — VITAMIN D 25 HYDROXY (VIT D DEFICIENCY, FRACTURES): VIT D 25 HYDROXY: 25 ng/mL — AB (ref 30–100)

## 2016-06-20 ENCOUNTER — Ambulatory Visit: Payer: Self-pay | Admitting: Nurse Practitioner

## 2016-08-28 ENCOUNTER — Ambulatory Visit (INDEPENDENT_AMBULATORY_CARE_PROVIDER_SITE_OTHER): Payer: 59 | Admitting: Orthopaedic Surgery

## 2016-08-28 ENCOUNTER — Ambulatory Visit (INDEPENDENT_AMBULATORY_CARE_PROVIDER_SITE_OTHER): Payer: 59

## 2016-08-28 DIAGNOSIS — M25552 Pain in left hip: Secondary | ICD-10-CM

## 2016-08-28 MED ORDER — METHOCARBAMOL 500 MG PO TABS: 500.0000 mg | ORAL_TABLET | Freq: Three times a day (TID) | ORAL | 0 refills | Status: DC | PRN

## 2016-08-28 MED ORDER — NAPROXEN 500 MG PO TABS: 500.0000 mg | ORAL_TABLET | Freq: Two times a day (BID) | ORAL | 3 refills | Status: DC | PRN

## 2016-08-28 NOTE — Progress Notes (Signed)
Office Visit Note   Patient: Sierra [name]           Date of Birth: 1980/10/20           MRN: [id number] Visit Date: 08/28/2016              Requested by: No referring provider defined for this encounter. PCP: No PCP Per Patient   Assessment & Plan: Visit Diagnoses:  1. Pain in left hip     Plan: This most likely is a muscle or tendinous issue like the iliopsoas tendon. I've told her if this happens again not to hesitate to give [place] a call. She would need to rest her hip for a day or 2 or even longer if it becomes recurrent problem. I'll send in some Robaxin and naproxen for her to take as needed when this occurs as well.  Follow-Up Instructions: Return if symptoms worsen or fail to improve.   Orders:  Orders Placed This Encounter  Procedures  . XR HIP UNILAT W OR W/O PELVIS 1V LEFT   Meds ordered this encounter  Medications  . methocarbamol (ROBAXIN) 500 MG tablet    Sig: Take 1 tablet (500 mg total) by mouth every 8 (eight) hours as needed for muscle spasms.    Dispense:  60 tablet    Refill:  0  . naproxen (NAPROSYN) 500 MG tablet    Sig: Take 1 tablet (500 mg total) by mouth 2 (two) times daily as needed.    Dispense:  60 tablet    Refill:  3      Procedures: No procedures performed   Clinical Data: No additional findings.   Subjective: No chief complaint on file. The patient is a very pleasant 36 year old comes in with acute left hip pain that occurred about 3 weeks ago. She felt like her hip "went out" she then had issues with weightbearing for just a day but then after a coworker gave her a muscle relaxant the next examination is normal. She still wanted make sure everything was doing fine and has not recurred since then. She denies any pain or groin pain today  HPI  Review of Systems She denies any headache, chest pain, shortness of breath, fever, chills, nausea, vomiting.  Objective: Vital Signs: There were no vitals taken for this visit.  Physical  Exam She is alert and oriented 3 and in no acute distress she is not walking with any significant limp Ortho Exam Her left hip is examined and has a normal exam today. She has fluid active and passive range of motion is full with no pain anywhere around the hip or back at all. Specialty Comments:  No specialty comments available.  Imaging: Xr Hip Unilat W Or W/o Pelvis 1v Left  Result Date: 08/28/2016 An AP pelvis and a lateral of her left hip show normal-appearing anatomy of the bone. The hip is well located. There is no significant arthritic changes or acute findings.    PMFS History: There are no active problems to display for this patient.  No past medical history on file.  Family History  Problem Relation Age of Onset  . Diabetes Father     type 2  . Cancer Maternal Grandmother 52    related to smoking, unsure type ? colon    No past surgical history on file. Social History   Occupational History  . Not on file.   Social History Main Topics  . Smoking status: Never Smoker  .  Smokeless tobacco: Never Used  . Alcohol use 1.5 oz/week    5 - 7 Glasses of wine per week     Comment: wine and occ mixed drink  . Drug use: No  . Sexual activity: Yes    Partners: Male    Birth control/ protection: Pill

## 2016-11-16 ENCOUNTER — Telehealth: Payer: Self-pay | Admitting: Nurse Practitioner

## 2016-11-16 NOTE — Telephone Encounter (Addendum)
Left message regarding upcoming appointment has been canceled and needs to be rescheduled., letter mailed.

## 2016-11-19 ENCOUNTER — Other Ambulatory Visit: Payer: Self-pay | Admitting: Nurse Practitioner

## 2016-11-19 NOTE — Telephone Encounter (Signed)
Medication refill request: OCP  Last AEX:  12-19-15  Next AEX: 01-02-17 Last MMG (if hormonal medication request): N/A Refill authorized: please advise  Sending to [email or web address]

## 2016-12-19 ENCOUNTER — Ambulatory Visit: Payer: Self-pay | Admitting: Nurse Practitioner

## 2017-01-02 ENCOUNTER — Encounter: Payer: Self-pay | Admitting: Obstetrics and Gynecology

## 2017-01-02 ENCOUNTER — Ambulatory Visit (INDEPENDENT_AMBULATORY_CARE_PROVIDER_SITE_OTHER): Payer: 59 | Admitting: Obstetrics and Gynecology

## 2017-01-02 VITALS — BP 118/78 | HR 88 | Resp 18 | Ht 71.0 in | Wt 265.0 lb

## 2017-01-02 DIAGNOSIS — Z01419 Encounter for gynecological examination (general) (routine) without abnormal findings: Secondary | ICD-10-CM | POA: Diagnosis not present

## 2017-01-02 DIAGNOSIS — Z0000 Encounter for general adult medical examination without abnormal findings: Secondary | ICD-10-CM | POA: Diagnosis not present

## 2017-01-02 DIAGNOSIS — E663 Overweight: Secondary | ICD-10-CM | POA: Diagnosis not present

## 2017-01-02 DIAGNOSIS — E559 Vitamin D deficiency, unspecified: Secondary | ICD-10-CM | POA: Diagnosis not present

## 2017-01-02 DIAGNOSIS — Z23 Encounter for immunization: Secondary | ICD-10-CM | POA: Diagnosis not present

## 2017-01-02 DIAGNOSIS — Z833 Family history of diabetes mellitus: Secondary | ICD-10-CM

## 2017-01-02 NOTE — Progress Notes (Signed)
36 y.o. G5P0050 SingleAfrican AmericanF here for annual exam.  Doing well on OCP's. Same partner x 7 years, no dyspareunia.  Period Cycle (Days): 28 Period Duration (Days): 3-4 days  Period Pattern: Regular Menstrual Flow: Light Menstrual Control: Tampon Menstrual Control Change Freq (Hours): changes tampon every 4 hours  Dysmenorrhea: None  She has lost some weight last year. She is working on weight loss.   Patient's last menstrual period was 12/30/2016.          Sexually active: Yes.    The current method of family planning is OCP (estrogen/progesterone).    Exercising: Yes.    cardio Smoker:  no  Health Maintenance: Pap:  11-30-14 WNL NEG HR HPV 09-13-11 WNL NEG HR HPV History of abnormal Pap:  no TDaP:  2008 Gardasil: N/A   reports that she has never smoked. She has never used smokeless tobacco. She reports that she drinks about 1.8 oz of alcohol per week . She reports that she does not use drugs. She works in [employer] in [employer].   No past medical history on file.  No past surgical history on file.  Current Outpatient Prescriptions  Medication Sig Dispense Refill  . JUNEL 1/20 1-20 MG-MCG tablet TAKE 1 TABLET BY MOUTH DAILY. *MAX PER INSURANCE* 3 Package 0  . Vitamin D, Ergocalciferol, (DRISDOL) 50000 units CAPS capsule Take 1 capsule (50,000 Units total) by mouth every 7 (seven) days. 30 capsule 1   No current facility-administered medications for this visit.     Family History  Problem Relation Age of Onset  . Diabetes Father        type 2  . Cancer Maternal Grandmother 42       related to smoking, unsure type ? colon    Review of Systems  Constitutional: Negative.   HENT: Negative.   Eyes: Negative.   Respiratory: Negative.   Cardiovascular: Negative.   Gastrointestinal: Negative.   Endocrine: Negative.   Genitourinary: Negative.   Musculoskeletal: Negative.   Skin: Negative.   Allergic/Immunologic: Negative.   Neurological: Negative.    Psychiatric/Behavioral: Negative.     Exam:   BP 118/78 (BP Location: Right Arm, Patient Position: Sitting, Cuff Size: Normal)   Pulse 88   Resp 18   Ht 5\' 11"  (1.803 m)   Wt 265 lb (120.2 kg)   LMP 12/30/2016   BMI 36.96 kg/m   Weight change: @WEIGHTCHANGE @ Height:   Height: 5\' 11"  (180.3 cm)  Ht Readings from Last 3 Encounters:  01/02/17 5\' 11"  (1.803 m)  12/19/15 5' 11.25" (1.81 m)  10/25/15 5\' 11"  (1.803 m)    General appearance: alert, cooperative and appears stated age Head: Normocephalic, without obvious abnormality, atraumatic Neck: no adenopathy, supple, symmetrical, trachea midline and thyroid normal to inspection and palpation Lungs: clear to auscultation bilaterally Cardiovascular: regular rate and rhythm Breasts: normal appearance, no masses or tenderness Abdomen: soft, non-tender; bowel sounds normal; no masses,  no organomegaly Extremities: extremities normal, atraumatic, no cyanosis or edema Skin: Skin color, texture, turgor normal. No rashes or lesions Lymph nodes: Cervical, supraclavicular, and axillary nodes normal. No abnormal inguinal nodes palpated Neurologic: Grossly normal   Pelvic: External genitalia:  no lesions              Urethra:  normal appearing urethra with no masses, tenderness or lesions              Bartholins and Skenes: normal  Vagina: normal appearing vagina with normal color and discharge, no lesions              Cervix: no lesions               Bimanual Exam:  Uterus:  limited by BMI, no masses              Adnexa: no mass, fullness, tenderness               Rectovaginal: Confirms               Anus:  normal sphincter tone, no lesions  Chaperone was present for exam.  A:  Well Woman with normal exam  H/O vit d def  FH of diabetes  P:   Continue OCP's  Pap next year  Screening labs  HgbA1C  Discussed healthy diet and exercise  Discussed breast self exam  Discussed calcium and vit D intake  TDAP  today

## 2017-01-02 NOTE — Patient Instructions (Signed)
EXERCISE AND DIET:  We recommended that you start or continue a regular exercise program for good health. Regular exercise means any activity that makes your heart beat faster and makes you sweat.  We recommend exercising at least 30 minutes per day at least 3 days a week, preferably 4 or 5.  We also recommend a diet low in fat and sugar.  Inactivity, poor dietary choices and obesity can cause diabetes, heart attack, stroke, and kidney damage, among others.    ALCOHOL AND SMOKING:  Women should limit their alcohol intake to no more than 7 drinks/beers/glasses of wine (combined, not each!) per week. Moderation of alcohol intake to this level decreases your risk of breast cancer and liver damage. And of course, no recreational drugs are part of a healthy lifestyle.  And absolutely no smoking or even second hand smoke. Most people know smoking can cause heart and lung diseases, but did you know it also contributes to weakening of your bones? Aging of your skin?  Yellowing of your teeth and nails?  CALCIUM AND VITAMIN D:  Adequate intake of calcium and Vitamin D are recommended.  The recommendations for exact amounts of these supplements seem to change often, but generally speaking 600 mg of calcium (either carbonate or citrate) and 800 units of Vitamin D per day seems prudent. Certain women may benefit from higher intake of Vitamin D.  If you are among these women, your doctor will have told you during your visit.    PAP SMEARS:  Pap smears, to check for cervical cancer or precancers,  have traditionally been done yearly, although recent scientific advances have shown that most women can have pap smears less often.  However, every woman still should have a physical exam from her gynecologist every year. It will include a breast check, inspection of the vulva and vagina to check for abnormal growths or skin changes, a visual exam of the cervix, and then an exam to evaluate the size and shape of the uterus and  ovaries.  And after 38 years of age, a rectal exam is indicated to check for rectal cancers. We will also provide age appropriate advice regarding health maintenance, like when you should have certain vaccines, screening for sexually transmitted diseases, bone density testing, colonoscopy, mammograms, etc.   MAMMOGRAMS:  All women over 40 years old should have a yearly mammogram. Many facilities now offer a "3D" mammogram, which may cost around $50 extra out of pocket. If possible,  we recommend you accept the option to have the 3D mammogram performed.  It both reduces the number of women who will be called back for extra views which then turn out to be normal, and it is better than the routine mammogram at detecting truly abnormal areas.    COLONOSCOPY:  Colonoscopy to screen for colon cancer is recommended for all women at age 50.  We know, you hate the idea of the prep.  We agree, BUT, having colon cancer and not knowing it is worse!!  Colon cancer so often starts as a polyp that can be seen and removed at colonscopy, which can quite literally save your life!  And if your first colonoscopy is normal and you have no family history of colon cancer, most women don't have to have it again for 10 years.  Once every ten years, you can do something that may end up saving your life, right?  We will be happy to help you get it scheduled when you are ready.    Be sure to check your insurance coverage so you understand how much it will cost.  It may be covered as a preventative service at no cost, but you should check your particular policy.      Breast Self-Awareness Breast self-awareness means being familiar with how your breasts look and feel. It involves checking your breasts regularly and reporting any changes to your health care provider. Practicing breast self-awareness is important. A change in your breasts can be a sign of a serious medical problem. Being familiar with how your breasts look and feel allows  you to find any problems early, when treatment is more likely to be successful. All women should practice breast self-awareness, including women who have had breast implants. How to do a breast self-exam One way to learn what is normal for your breasts and whether your breasts are changing is to do a breast self-exam. To do a breast self-exam: Look for Changes  1. Remove all the clothing above your waist. 2. Stand in front of a mirror in a room with good lighting. 3. Put your hands on your hips. 4. Push your hands firmly downward. 5. Compare your breasts in the mirror. Look for differences between them (asymmetry), such as: ? Differences in shape. ? Differences in size. ? Puckers, dips, and bumps in one breast and not the other. 6. Look at each breast for changes in your skin, such as: ? Redness. ? Scaly areas. 7. Look for changes in your nipples, such as: ? Discharge. ? Bleeding. ? Dimpling. ? Redness. ? A change in position. Feel for Changes  Carefully feel your breasts for lumps and changes. It is best to do this while lying on your back on the floor and again while sitting or standing in the shower or tub with soapy water on your skin. Feel each breast in the following way:  Place the arm on the side of the breast you are examining above your head.  Feel your breast with the other hand.  Start in the nipple area and make  inch (2 cm) overlapping circles to feel your breast. Use the pads of your three middle fingers to do this. Apply light pressure, then medium pressure, then firm pressure. The light pressure will allow you to feel the tissue closest to the skin. The medium pressure will allow you to feel the tissue that is a little deeper. The firm pressure will allow you to feel the tissue close to the ribs.  Continue the overlapping circles, moving downward over the breast until you feel your ribs below your breast.  Move one finger-width toward the center of the body.  Continue to use the  inch (2 cm) overlapping circles to feel your breast as you move slowly up toward your collarbone.  Continue the up and down exam using all three pressures until you reach your armpit.  Write Down What You Find  Write down what is normal for each breast and any changes that you find. Keep a written record with breast changes or normal findings for each breast. By writing this information down, you do not need to depend only on memory for size, tenderness, or location. Write down where you are in your menstrual cycle, if you are still menstruating. If you are having trouble noticing differences in your breasts, do not get discouraged. With time you will become more familiar with the variations in your breasts and more comfortable with the exam. How often should I examine my breasts? Examine   your breasts every month. If you are breastfeeding, the best time to examine your breasts is after a feeding or after using a breast pump. If you menstruate, the best time to examine your breasts is 5-7 days after your period is over. During your period, your breasts are lumpier, and it may be more difficult to notice changes. When should I see my health care provider? See your health care provider if you notice:  A change in shape or size of your breasts or nipples.  A change in the skin of your breast or nipples, such as a reddened or scaly area.  Unusual discharge from your nipples.  A lump or thick area that was not there before.  Pain in your breasts.  Anything that concerns you.  This information is not intended to replace advice given to you by your health care provider. Make sure you discuss any questions you have with your health care provider. Document Released: 04/30/2005 Document Revised: 10/06/2015 Document Reviewed: 03/20/2015 Elsevier Interactive Patient Education  2018 [employer]  

## 2017-01-03 ENCOUNTER — Telehealth: Payer: Self-pay | Admitting: *Deleted

## 2017-01-03 LAB — COMPREHENSIVE METABOLIC PANEL
ALT: 33 IU/L — AB (ref 0–32)
AST: 16 IU/L (ref 0–40)
Albumin/Globulin Ratio: 1.9 (ref 1.2–2.2)
Albumin: 4.6 g/dL (ref 3.5–5.5)
Alkaline Phosphatase: 66 IU/L (ref 39–117)
BUN/Creatinine Ratio: 17 (ref 9–23)
BUN: 12 mg/dL (ref 6–20)
Bilirubin Total: 0.3 mg/dL (ref 0.0–1.2)
CALCIUM: 9.4 mg/dL (ref 8.7–10.2)
CO2: 21 mmol/L (ref 20–29)
CREATININE: 0.69 mg/dL (ref 0.57–1.00)
Chloride: 102 mmol/L (ref 96–106)
GFR, EST AFRICAN AMERICAN: 128 mL/min/{1.73_m2} (ref >59)
GFR, EST NON AFRICAN AMERICAN: 111 mL/min/{1.73_m2} (ref >59)
Globulin, Total: 2.4 g/dL (ref 1.5–4.5)
Glucose: 96 mg/dL (ref 65–99)
Potassium: 4.5 mmol/L (ref 3.5–5.2)
Sodium: 139 mmol/L (ref 134–144)
TOTAL PROTEIN: 7 g/dL (ref 6.0–8.5)

## 2017-01-03 LAB — LIPID PANEL
CHOLESTEROL TOTAL: 136 mg/dL (ref 100–199)
Chol/HDL Ratio: 2.4 ratio (ref 0.0–4.4)
HDL: 56 mg/dL (ref >39)
LDL CALC: 66 mg/dL (ref 0–99)
TRIGLYCERIDES: 68 mg/dL (ref 0–149)
VLDL CHOLESTEROL CAL: 14 mg/dL (ref 5–40)

## 2017-01-03 LAB — HEMOGLOBIN A1C
Est. average glucose Bld gHb Est-mCnc: 108 mg/dL
Hgb A1c MFr Bld: 5.4 % (ref 4.8–5.6)

## 2017-01-03 LAB — CBC
Hematocrit: 39.2 % (ref 34.0–46.6)
Hemoglobin: 12.7 g/dL (ref 11.1–15.9)
MCH: 24.1 pg — ABNORMAL LOW (ref 26.6–33.0)
MCHC: 32.4 g/dL (ref 31.5–35.7)
MCV: 74 fL — AB (ref 79–97)
PLATELETS: 246 10*3/uL (ref 150–379)
RBC: 5.28 x10E6/uL (ref 3.77–5.28)
RDW: 14.4 % (ref 12.3–15.4)
WBC: 5.9 10*3/uL (ref 3.4–10.8)

## 2017-01-03 LAB — VITAMIN D 25 HYDROXY (VIT D DEFICIENCY, FRACTURES): VIT D 25 HYDROXY: 30.2 ng/mL (ref 30.0–100.0)

## 2017-01-03 LAB — TSH: TSH: 0.935 u[IU]/mL (ref 0.450–4.500)

## 2017-01-03 NOTE — Telephone Encounter (Signed)
-----   Message from Romualdo Bolk, MD sent at 01/03/2017  2:24 PM EDT ----- Please inform the patient that one of her LFT's is just minimally elevated. Her vit d level is low, but better than last year (I think she told me that she doesn't always remember to take the weekly vit D). I would recommend that she take 1,000 IU a day of vit D 3. She should drink minimal ETOH and limit even over the counter meds like tylenol and NSAIDS. She should have a liver panel checked in 3 months. I think it is fine for her to be on OCP's with minimal elevation of LFT's.

## 2017-01-03 NOTE — Telephone Encounter (Signed)
Left message to call regarding lab results -eh 

## 2017-01-03 NOTE — Telephone Encounter (Signed)
Patient notified

## 2017-01-31 ENCOUNTER — Other Ambulatory Visit: Payer: Self-pay | Admitting: Obstetrics and Gynecology

## 2017-01-31 NOTE — Telephone Encounter (Signed)
Medication refill request: Junel  Last AEX:  01/02/17 JJ Next AEX: 01/09/18 JJ Last MMG (if hormonal medication request): none Refill authorized: 11/19/16 [id number]. Today please advise.

## 2017-03-29 ENCOUNTER — Other Ambulatory Visit (INDEPENDENT_AMBULATORY_CARE_PROVIDER_SITE_OTHER): Payer: 59

## 2017-03-29 ENCOUNTER — Other Ambulatory Visit: Payer: Self-pay

## 2017-03-29 DIAGNOSIS — R748 Abnormal levels of other serum enzymes: Secondary | ICD-10-CM

## 2017-03-29 NOTE — Progress Notes (Signed)
Order for liver function panel placed for recheck today.

## 2017-03-30 LAB — HEPATIC FUNCTION PANEL
ALBUMIN: 4.7 g/dL (ref 3.5–5.5)
ALK PHOS: 65 IU/L (ref 39–117)
ALT: 30 IU/L (ref 0–32)
AST: 19 IU/L (ref 0–40)
BILIRUBIN TOTAL: 0.3 mg/dL (ref 0.0–1.2)
BILIRUBIN, DIRECT: 0.1 mg/dL (ref 0.00–0.40)
TOTAL PROTEIN: 7.1 g/dL (ref 6.0–8.5)

## 2018-01-09 ENCOUNTER — Ambulatory Visit: Payer: 59 | Admitting: Obstetrics and Gynecology

## 2018-01-18 ENCOUNTER — Other Ambulatory Visit: Payer: Self-pay | Admitting: Obstetrics and Gynecology

## 2018-01-27 NOTE — Progress Notes (Signed)
37 y.o. [id number] Single Black or African American Not Hispanic or Latino female here for annual exam.  Same long term partner, not living together. No dyspareunia.  Period Cycle (Days): 30 Period Duration (Days): 4-5 Period Pattern: Regular Menstrual Flow: Light Menstrual Control: Tampon Menstrual Control Change Freq (Hours): every 4 hours Dysmenorrhea: None  Patient's last menstrual period was 01/08/2018.          Sexually active: Yes.    The current method of family planning is OCP (estrogen/progesterone).    Exercising: Yes.     Smoker:  no  Health Maintenance: Pap:  2016 negative, negative hpv History of abnormal Pap:  no MMG:  n/a BMD:   n/a Colonoscopy: n/a TDaP:  2018 Gardasil: never, discussed   reports that she has never smoked. She has never used smokeless tobacco. She reports that she drinks about 3.0 standard drinks of alcohol per week. She reports that she does not use drugs. She works in [employer]   Past Medical History:  Diagnosis Date  . Family history of diabetes mellitus (DM)   . History of vitamin D deficiency     No past surgical history on file.  Current Outpatient Medications  Medication Sig Dispense Refill  . norethindrone-ethinyl estradiol (JUNEL 1/20) 1-20 MG-MCG tablet Take 1 tablet by mouth daily. 1 Package 0  . phentermine 37.5 MG capsule Take 37.5 mg by mouth every morning.     No current facility-administered medications for this visit.   She isn't remembering the phentermine.   Family History  Problem Relation Age of Onset  . Diabetes Father        type 2  . Cancer Maternal Grandmother [id number]       related to smoking, unsure type ? colon    Review of Systems  Constitutional: Negative.   HENT: Negative.   Eyes: Negative.   Respiratory: Negative.   Cardiovascular: Negative.   Gastrointestinal: Negative.   Endocrine: Negative.   Genitourinary: Negative.   Musculoskeletal: Negative.   Skin: Negative.    Allergic/Immunologic: Negative.   Neurological: Negative.   Hematological: Negative.   Psychiatric/Behavioral: Negative.   All other systems reviewed and are negative.   Exam:   BP 110/80   Pulse 90   Ht 5\' 11"  (1.803 m)   Wt 266 lb (120.7 kg)   LMP 01/08/2018   BMI 37.10 kg/m   Weight change: @WEIGHTCHANGE @ Height:   Height: 5\' 11"  (180.3 cm)  Ht Readings from Last 3 Encounters:  01/28/18 5\' 11"  (1.803 m)  01/02/17 5\' 11"  (1.803 m)  12/19/15 5' 11.25" (1.81 m)    General appearance: alert, cooperative and appears stated age Head: Normocephalic, without obvious abnormality, atraumatic Neck: no adenopathy, supple, symmetrical, trachea midline and thyroid normal to inspection and palpation Lungs: clear to auscultation bilaterally Cardiovascular: regular rate and rhythm Breasts: normal appearance, no masses or tenderness  She has a large sebaceous cyst in her left axilla (patient states there for years) Abdomen: soft, non-tender; non distended,  no masses,  no organomegaly Extremities: extremities normal, atraumatic, no cyanosis or edema Skin: Skin color, texture, turgor normal. No rashes or lesions Lymph nodes: Cervical, supraclavicular, and axillary nodes normal. No abnormal inguinal nodes palpated Neurologic: Grossly normal   Pelvic: External genitalia:  no lesions              Urethra:  normal appearing urethra with no masses, tenderness or lesions  Bartholins and Skenes: normal                 Vagina: normal appearing vagina with normal color and discharge, no lesions              Cervix: no lesions               Bimanual Exam:  Uterus:  normal size, contour, position, consistency, mobility, non-tender              Adnexa: no mass, fullness, tenderness               Rectovaginal: Confirms               Anus:  normal sphincter tone, no lesions  Chaperone was present for exam.  A:  Well Woman with normal exam  H/O vit d def  FH of DM  BMI 37,  exercising 3 days a week, discussed weight watchers  On OCP's, doing well  P:   Discussed gardasil, she would like to get it when covered  Screening labs, vit d and HgbA1C, TSH  Continue OCP's  Mammogram next year  Discussed breast self exam  Discussed calcium and vit D intake  Pap with hpv

## 2018-01-28 ENCOUNTER — Other Ambulatory Visit (HOSPITAL_COMMUNITY)
Admission: RE | Admit: 2018-01-28 | Discharge: 2018-01-28 | Disposition: A | Discharge status: Home / Self Care | Payer: 59 | Source: Ambulatory Visit | Attending: Obstetrics and Gynecology | Admitting: Obstetrics and Gynecology

## 2018-01-28 ENCOUNTER — Encounter: Payer: Self-pay | Admitting: Obstetrics and Gynecology

## 2018-01-28 ENCOUNTER — Ambulatory Visit: Payer: 59 | Admitting: Obstetrics and Gynecology

## 2018-01-28 VITALS — BP 110/80 | HR 90 | Ht 71.0 in | Wt 266.0 lb

## 2018-01-28 DIAGNOSIS — Z833 Family history of diabetes mellitus: Secondary | ICD-10-CM

## 2018-01-28 DIAGNOSIS — Z124 Encounter for screening for malignant neoplasm of cervix: Secondary | ICD-10-CM | POA: Diagnosis present

## 2018-01-28 DIAGNOSIS — Z3041 Encounter for surveillance of contraceptive pills: Secondary | ICD-10-CM

## 2018-01-28 DIAGNOSIS — Z6837 Body mass index (BMI) 37.0-37.9, adult: Secondary | ICD-10-CM

## 2018-01-28 DIAGNOSIS — E559 Vitamin D deficiency, unspecified: Secondary | ICD-10-CM

## 2018-01-28 DIAGNOSIS — Z01419 Encounter for gynecological examination (general) (routine) without abnormal findings: Secondary | ICD-10-CM

## 2018-01-28 DIAGNOSIS — Z0000 Encounter for general adult medical examination without abnormal findings: Secondary | ICD-10-CM

## 2018-01-28 MED ORDER — NORETHINDRONE ACET-ETHINYL EST 1-20 MG-MCG PO TABS: 1.0000 | ORAL_TABLET | Freq: Every day | ORAL | 3 refills | Status: DC

## 2018-01-28 NOTE — Patient Instructions (Signed)
EXERCISE AND DIET:  We recommended that you start or continue a regular exercise program for good health. Regular exercise means any activity that makes your heart beat faster and makes you sweat.  We recommend exercising at least 30 minutes per day at least 3 days a week, preferably 4 or 5.  We also recommend a diet low in fat and sugar.  Inactivity, poor dietary choices and obesity can cause diabetes, heart attack, stroke, and kidney damage, among others.    ALCOHOL AND SMOKING:  Women should limit their alcohol intake to no more than 7 drinks/beers/glasses of wine (combined, not each!) per week. Moderation of alcohol intake to this level decreases your risk of breast cancer and liver damage. And of course, no recreational drugs are part of a healthy lifestyle.  And absolutely no smoking or even second hand smoke. Most people know smoking can cause heart and lung diseases, but did you know it also contributes to weakening of your bones? Aging of your skin?  Yellowing of your teeth and nails?  CALCIUM AND VITAMIN D:  Adequate intake of calcium and Vitamin D are recommended.  The recommendations for exact amounts of these supplements seem to change often, but generally speaking 600 mg of calcium (either carbonate or citrate) and 800 units of Vitamin D per day seems prudent. Certain women may benefit from higher intake of Vitamin D.  If you are among these women, your doctor will have told you during your visit.    PAP SMEARS:  Pap smears, to check for cervical cancer or precancers,  have traditionally been done yearly, although recent scientific advances have shown that most women can have pap smears less often.  However, every woman still should have a physical exam from her gynecologist every year. It will include a breast check, inspection of the vulva and vagina to check for abnormal growths or skin changes, a visual exam of the cervix, and then an exam to evaluate the size and shape of the uterus and  ovaries.  And after 40 years of age, a rectal exam is indicated to check for rectal cancers. We will also provide age appropriate advice regarding health maintenance, like when you should have certain vaccines, screening for sexually transmitted diseases, bone density testing, colonoscopy, mammograms, etc.   MAMMOGRAMS:  All women over 40 years old should have a yearly mammogram. Many facilities now offer a "3D" mammogram, which may cost around $50 extra out of pocket. If possible,  we recommend you accept the option to have the 3D mammogram performed.  It both reduces the number of women who will be called back for extra views which then turn out to be normal, and it is better than the routine mammogram at detecting truly abnormal areas.    COLONOSCOPY:  Colonoscopy to screen for colon cancer is recommended for all women at age 50.  We know, you hate the idea of the prep.  We agree, BUT, having colon cancer and not knowing it is worse!!  Colon cancer so often starts as a polyp that can be seen and removed at colonscopy, which can quite literally save your life!  And if your first colonoscopy is normal and you have no family history of colon cancer, most women don't have to have it again for 10 years.  Once every ten years, you can do something that may end up saving your life, right?  We will be happy to help you get it scheduled when you are ready.    Be sure to check your insurance coverage so you understand how much it will cost.  It may be covered as a preventative service at no cost, but you should check your particular policy.      Breast Self-Awareness Breast self-awareness means being familiar with how your breasts look and feel. It involves checking your breasts regularly and reporting any changes to your health care provider. Practicing breast self-awareness is important. A change in your breasts can be a sign of a serious medical problem. Being familiar with how your breasts look and feel allows  you to find any problems early, when treatment is more likely to be successful. All women should practice breast self-awareness, including women who have had breast implants. How to do a breast self-exam One way to learn what is normal for your breasts and whether your breasts are changing is to do a breast self-exam. To do a breast self-exam: Look for Changes  1. Remove all the clothing above your waist. 2. Stand in front of a mirror in a room with good lighting. 3. Put your hands on your hips. 4. Push your hands firmly downward. 5. Compare your breasts in the mirror. Look for differences between them (asymmetry), such as: ? Differences in shape. ? Differences in size. ? Puckers, dips, and bumps in one breast and not the other. 6. Look at each breast for changes in your skin, such as: ? Redness. ? Scaly areas. 7. Look for changes in your nipples, such as: ? Discharge. ? Bleeding. ? Dimpling. ? Redness. ? A change in position. Feel for Changes  Carefully feel your breasts for lumps and changes. It is best to do this while lying on your back on the floor and again while sitting or standing in the shower or tub with soapy water on your skin. Feel each breast in the following way:  Place the arm on the side of the breast you are examining above your head.  Feel your breast with the other hand.  Start in the nipple area and make  inch (2 cm) overlapping circles to feel your breast. Use the pads of your three middle fingers to do this. Apply light pressure, then medium pressure, then firm pressure. The light pressure will allow you to feel the tissue closest to the skin. The medium pressure will allow you to feel the tissue that is a little deeper. The firm pressure will allow you to feel the tissue close to the ribs.  Continue the overlapping circles, moving downward over the breast until you feel your ribs below your breast.  Move one finger-width toward the center of the body.  Continue to use the  inch (2 cm) overlapping circles to feel your breast as you move slowly up toward your collarbone.  Continue the up and down exam using all three pressures until you reach your armpit.  Write Down What You Find  Write down what is normal for each breast and any changes that you find. Keep a written record with breast changes or normal findings for each breast. By writing this information down, you do not need to depend only on memory for size, tenderness, or location. Write down where you are in your menstrual cycle, if you are still menstruating. If you are having trouble noticing differences in your breasts, do not get discouraged. With time you will become more familiar with the variations in your breasts and more comfortable with the exam. How often should I examine my breasts? Examine   your breasts every month. If you are breastfeeding, the best time to examine your breasts is after a feeding or after using a breast pump. If you menstruate, the best time to examine your breasts is 5-7 days after your period is over. During your period, your breasts are lumpier, and it may be more difficult to notice changes. When should I see my health care provider? See your health care provider if you notice:  A change in shape or size of your breasts or nipples.  A change in the skin of your breast or nipples, such as a reddened or scaly area.  Unusual discharge from your nipples.  A lump or thick area that was not there before.  Pain in your breasts.  Anything that concerns you.  This information is not intended to replace advice given to you by your health care provider. Make sure you discuss any questions you have with your health care provider. Document Released: 04/30/2005 Document Revised: 10/06/2015 Document Reviewed: 03/20/2015 Elsevier Interactive Patient Education  2018 Elsevier Inc[employer]  

## 2018-01-29 LAB — COMPREHENSIVE METABOLIC PANEL
ALK PHOS: 55 IU/L (ref 39–117)
ALT: 13 IU/L (ref 0–32)
AST: 10 IU/L (ref 0–40)
Albumin/Globulin Ratio: 1.7 (ref 1.2–2.2)
Albumin: 4.3 g/dL (ref 3.5–5.5)
BILIRUBIN TOTAL: 0.2 mg/dL (ref 0.0–1.2)
BUN/Creatinine Ratio: 22 (ref 9–23)
BUN: 15 mg/dL (ref 6–20)
CHLORIDE: 104 mmol/L (ref 96–106)
CO2: 19 mmol/L — AB (ref 20–29)
CREATININE: 0.67 mg/dL (ref 0.57–1.00)
Calcium: 9.2 mg/dL (ref 8.7–10.2)
GFR calc Af Amer: 128 mL/min/{1.73_m2} (ref >59)
GFR calc non Af Amer: 111 mL/min/{1.73_m2} (ref >59)
GLUCOSE: 97 mg/dL (ref 65–99)
Globulin, Total: 2.6 g/dL (ref 1.5–4.5)
Potassium: 4.6 mmol/L (ref 3.5–5.2)
SODIUM: 141 mmol/L (ref 134–144)
Total Protein: 6.9 g/dL (ref 6.0–8.5)

## 2018-01-29 LAB — VITAMIN D 25 HYDROXY (VIT D DEFICIENCY, FRACTURES): Vit D, 25-Hydroxy: 16 ng/mL — ABNORMAL LOW (ref 30.0–100.0)

## 2018-01-29 LAB — LIPID PANEL
Chol/HDL Ratio: 2.4 ratio (ref 0.0–4.4)
Cholesterol, Total: 125 mg/dL (ref 100–199)
HDL: 52 mg/dL (ref >39)
LDL Calculated: 60 mg/dL (ref 0–99)
TRIGLYCERIDES: 66 mg/dL (ref 0–149)
VLDL Cholesterol Cal: 13 mg/dL (ref 5–40)

## 2018-01-29 LAB — CBC
Hematocrit: 39.1 % (ref 34.0–46.6)
Hemoglobin: 12.3 g/dL (ref 11.1–15.9)
MCH: 23.5 pg — ABNORMAL LOW (ref 26.6–33.0)
MCHC: 31.5 g/dL (ref 31.5–35.7)
MCV: 75 fL — ABNORMAL LOW (ref 79–97)
Platelets: 265 10*3/uL (ref 150–450)
RBC: 5.23 x10E6/uL (ref 3.77–5.28)
RDW: 14.2 % (ref 12.3–15.4)
WBC: 6.8 10*3/uL (ref 3.4–10.8)

## 2018-01-29 LAB — TSH: TSH: 1.09 u[IU]/mL (ref 0.450–4.500)

## 2018-01-29 LAB — HEMOGLOBIN A1C
ESTIMATED AVERAGE GLUCOSE: 108 mg/dL
HEMOGLOBIN A1C: 5.4 % (ref 4.8–5.6)

## 2018-01-31 LAB — CYTOLOGY - PAP
Diagnosis: NEGATIVE
HPV: NOT DETECTED

## 2018-02-02 ENCOUNTER — Other Ambulatory Visit: Payer: Self-pay | Admitting: Obstetrics and Gynecology

## 2018-02-02 DIAGNOSIS — E559 Vitamin D deficiency, unspecified: Secondary | ICD-10-CM

## 2018-10-09 ENCOUNTER — Other Ambulatory Visit: Payer: Self-pay

## 2018-10-09 ENCOUNTER — Ambulatory Visit (HOSPITAL_COMMUNITY)
Admission: EM | Admit: 2018-10-09 | Discharge: 2018-10-09 | Disposition: A | Discharge status: Home / Self Care | Payer: 59 | Attending: Internal Medicine | Admitting: Internal Medicine

## 2018-10-09 ENCOUNTER — Encounter (HOSPITAL_COMMUNITY): Payer: Self-pay | Admitting: Emergency Medicine

## 2018-10-09 DIAGNOSIS — B9689 Other specified bacterial agents as the cause of diseases classified elsewhere: Secondary | ICD-10-CM

## 2018-10-09 DIAGNOSIS — L02412 Cutaneous abscess of left axilla: Secondary | ICD-10-CM | POA: Diagnosis not present

## 2018-10-09 DIAGNOSIS — L723 Sebaceous cyst: Secondary | ICD-10-CM | POA: Diagnosis not present

## 2018-10-09 MED ORDER — DOXYCYCLINE HYCLATE 100 MG PO CAPS: 100.0000 mg | ORAL_CAPSULE | Freq: Two times a day (BID) | ORAL | 0 refills | Status: DC

## 2018-10-09 NOTE — Discharge Instructions (Signed)
Apply warm compresses to the area 3-4 times daily for additional relief. Take antibiotic as directed. Return if pain, swelling, discharge worsens.

## 2018-10-09 NOTE — ED Triage Notes (Signed)
Left axilla with abscess.  History of the same

## 2018-10-09 NOTE — ED Provider Notes (Signed)
[facility]    CSN: [id number] Arrival date & time: 10/09/18  1727     History   Chief Complaint Chief Complaint  Patient presents with  . Abscess    HPI Sierra [name] is a 38 y.o. female presenting for acute concern of left axillary boil.  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  Patient is taken antibiotics in the past as well which is helped.  Patient states that she shaved 2 days ago and noticed an acute flareup.  Patient endorses pain, redness, swelling.  No active discharge, fever, radiation of pain.    Past Medical History:  Diagnosis Date  . Family history of diabetes mellitus (DM)   . History of vitamin D deficiency     There are no active problems to display for this patient.   History reviewed. No pertinent surgical history.  OB History    Gravida  5   Para  0   Term  0   Preterm  0   AB  5   Living  0     SAB  1   TAB  4   Ectopic  0   Multiple  0   Live Births  0            Home Medications    Prior to Admission medications   Medication Sig Start Date End Date Taking? Authorizing Provider  doxycycline (VIBRAMYCIN) 100 MG capsule Take 1 capsule (100 mg total) by mouth 2 (two) times daily. 10/09/18   Hall-Potvin, [place], PA-C  norethindrone-ethinyl estradiol (JUNEL 1/20) 1-20 MG-MCG tablet Take 1 tablet by mouth daily. 01/28/18   Romualdo BolkJertson, Jill Evelyn, MD  phentermine 37.5 MG capsule Take 37.5 mg by mouth every morning.    [provider]    Family History Family History  Problem Relation Age of Onset  . Diabetes Father        type 2  . Cancer Maternal Grandmother 666       related to smoking, unsure type ? colon    Social History Social History   Tobacco Use  . Smoking status: Never Smoker  . Smokeless tobacco: Never Used  Substance Use Topics  . Alcohol use: Yes    Alcohol/week: 3.0 standard drinks    Types: 3 Glasses of wine per week    Comment: wine and occ mixed  drink  . Drug use: No     Allergies   Patient has no known allergies.   Review of Systems Review of Systems as per HPI   Physical Exam Triage Vital Signs ED Triage Vitals  Enc Vitals Group     BP 10/09/18 1811 129/80     Pulse Rate 10/09/18 1811 (!) 107     Resp 10/09/18 1811 18     Temp 10/09/18 1811 98.9 F (37.2 C)     Temp Source 10/09/18 1811 Oral     SpO2 10/09/18 1811 97 %     Weight --      Height --      Head Circumference --      Peak Flow --      Pain Score 10/09/18 1808 9     Pain Loc --      Pain Edu? --      Excl. in GC? --    No data found.  Updated Vital Signs BP 129/80 (BP Location: Right Arm)   Pulse (!) 107   Temp 98.9 F (37.2  C) (Oral)   Resp 18   LMP 09/28/2018   SpO2 97%   Visual Acuity Right Eye Distance:   Left Eye Distance:   Bilateral Distance:    Right Eye Near:   Left Eye Near:    Bilateral Near:     Physical Exam Constitutional:      General: She is not in acute distress.    Appearance: She is obese.  HENT:     Head: Normocephalic and atraumatic.  Cardiovascular:     Rate and Rhythm: Regular rhythm. Tachycardia present.     Heart sounds: No murmur.  Pulmonary:     Effort: Pulmonary effort is normal. No respiratory distress.  Skin:    Comments: 7 cm area of swelling and fluctuance E.  Erythema extending beyond border of abscess.  1 to 2 cm border of surrounding induration.  Lesion is warm and tender to palpation, no active discharge or streaking  Neurological:     Mental Status: She is alert.      UC Treatments / Results  Labs (all labs ordered are listed, but only abnormal results are displayed) Labs Reviewed - No data to display  EKG None  Radiology No results found.  Procedures Incision and Drainage Date/Time: 10/09/2018 7:08 PM Performed by: Shea Evans, PA-C Authorized by: Merrilee Jansky, MD   Consent:    Consent obtained:  Verbal   Consent given by:  Patient   Risks discussed:   Bleeding, incomplete drainage, pain, damage to other organs and infection   Alternatives discussed:  No treatment Universal protocol:    Patient identity confirmed:  Verbally with patient Location:    Type:  Abscess   Size:  7 cm   Location: left axilla. Pre-procedure details:    Skin preparation:  Betadine Anesthesia (see MAR for exact dosages):    Anesthesia method:  Local infiltration   Local anesthetic:  Lidocaine 2% WITH epi Procedure type:    Complexity:  Complex Procedure details:    Incision types:  Single straight   Incision depth:  Subcutaneous   Scalpel blade:  11   Wound management:  Probed and deloculated, irrigated with saline and extensive cleaning   Drainage:  Purulent and bloody   Drainage amount:  Copious   Wound treatment:  Wound left open   Packing materials:  None Post-procedure details:    Patient tolerance of procedure:  Tolerated well, no immediate complications   (including critical care time)  Medications Ordered in UC Medications - No data to display  Initial Impression / Assessment and Plan / UC Course  I have reviewed the triage vital signs and the nursing notes.  Pertinent labs & imaging results that were available during my care of the patient were reviewed by me and considered in my medical decision making (see chart for details).     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  Lesion I&D in office today.  Patient to start oral antibiotics due to size and depth of lesion.  Recommended patient follow-up with surgery due to recurrent abscesses as well as probable sebaceous cyst discovered during procedure.   Final Clinical Impressions(s) / UC Diagnoses   Final diagnoses:  Abscess of left axilla  Sebaceous cyst of left axilla     Discharge Instructions     Apply warm compresses to the area 3-4 times daily for additional relief. Take antibiotic as directed. Return if pain, swelling, discharge worsens.   ED  Prescriptions    Medication Sig Dispense  Auth. Provider   doxycycline (VIBRAMYCIN) 100 MG capsule Take 1 capsule (100 mg total) by mouth 2 (two) times daily. 20 capsule Hall-Potvin, [place], PA-C     Controlled Substance Prescriptions [place] Controlled Substance Registry consulted? Not Applicable   Jetta Lout 10/09/18 1909

## 2019-02-03 ENCOUNTER — Telehealth: Payer: Self-pay | Admitting: Obstetrics and Gynecology

## 2019-02-03 ENCOUNTER — Other Ambulatory Visit: Payer: Self-pay

## 2019-02-03 NOTE — Telephone Encounter (Signed)
Spoke with patient. Patient reports cough present for approximately 1 wk, denies any other symptoms. Recommended rescheduling AEX to later date, f/u with PCP for further evaluation of symptoms. Patient agreeable.   No hx of abnormal pap. Patient is on OCP, has 1 month left. AEX r/s to 03/04/19 at 9:30am.   Routing to provider for final review. Patient is agreeable to disposition. Will close encounter.

## 2019-02-03 NOTE — Telephone Encounter (Signed)
Call to patient to go over Covid pre-screening questions. Patient stated that she does have a cough.

## 2019-02-05 ENCOUNTER — Ambulatory Visit: Payer: 59 | Admitting: Obstetrics and Gynecology

## 2019-03-02 ENCOUNTER — Other Ambulatory Visit: Payer: Self-pay

## 2019-03-02 NOTE — Progress Notes (Signed)
38 y.o. [id number] Single Black or African American Not Hispanic or Latino female here for annual exam.  Same long term partner, don't live together. No dyspareunia.   Period Cycle (Days): 28 Period Duration (Days): 5 days Period Pattern: Regular Menstrual Flow: Light Menstrual Control: Tampon, Panty liner Menstrual Control Change Freq (Hours): changes tampon every 5-6 hours Dysmenorrhea: None  Patient's last menstrual period was 02/24/2019 (exact date).          Sexually active: Yes.    The current method of family planning is OCP (estrogen/progesterone).    Exercising: Yes.    walking Smoker:  no  Health Maintenance: Pap:  01/28/2018 WNL NEG HPV, 2016 negative, negative hpv History of abnormal Pap:  no MMG:  n/a BMD:   n/a Colonoscopy: n/a TDaP:  01/02/2017 Gardasil: Never   reports that she has never smoked. She has never used smokeless tobacco. She reports current alcohol use of about 3.0 standard drinks of alcohol per week. She reports that she does not use drugs. She works in [employer] in [employer].  Past Medical History:  Diagnosis Date  . Family history of diabetes mellitus (DM)   . History of vitamin D deficiency     History reviewed. No pertinent surgical history.  Current Outpatient Medications  Medication Sig Dispense Refill  . norethindrone-ethinyl estradiol (JUNEL 1/20) 1-20 MG-MCG tablet Take 1 tablet by mouth daily. 3 Package 3  . phentermine 37.5 MG capsule Take 37.5 mg by mouth every morning.     No current facility-administered medications for this visit.     Family History  Problem Relation Age of Onset  . Diabetes Father        type 2  . Cancer Maternal Grandmother 64       related to smoking, unsure type ? colon    Review of Systems  Constitutional: Negative.   HENT: Negative.   Eyes: Negative.   Respiratory: Negative.   Cardiovascular: Negative.   Gastrointestinal: Negative.   Endocrine: Negative.   Genitourinary: Negative.    Musculoskeletal: Negative.   Skin: Negative.   Allergic/Immunologic: Negative.   Neurological: Negative.   Hematological: Negative.   Psychiatric/Behavioral: Negative.     Exam:   BP 118/78 (BP Location: Right Arm, Patient Position: Sitting, Cuff Size: Normal)   Pulse 64   Temp (!) 97.2 F (36.2 C) (Skin)   Ht 5\' 11"  (1.803 m)   Wt 253 lb 9.6 oz (115 kg)   LMP 02/24/2019 (Exact Date)   BMI 35.37 kg/m   Weight change: @WEIGHTCHANGE @ Height:   Height: 5\' 11"  (180.3 cm)  Ht Readings from Last 3 Encounters:  03/04/19 5\' 11"  (1.803 m)  01/28/18 5\' 11"  (1.803 m)  01/02/17 5\' 11"  (1.803 m)    General appearance: alert, cooperative and appears stated age Head: Normocephalic, without obvious abnormality, atraumatic Neck: no adenopathy, supple, symmetrical, trachea midline and thyroid normal to inspection and palpation Lungs: clear to auscultation bilaterally Cardiovascular: regular rate and rhythm Breasts: normal appearance, no masses or tenderness Abdomen: soft, non-tender; non distended,  no masses,  no organomegaly Extremities: extremities normal, atraumatic, no cyanosis or edema Skin: Skin color, texture, turgor normal. No rashes or lesions Lymph nodes: Cervical, supraclavicular, and axillary nodes normal. No abnormal inguinal nodes palpated Neurologic: Grossly normal   Pelvic: External genitalia:  no lesions              Urethra:  normal appearing urethra with no masses, tenderness or lesions  Bartholins and Skenes: normal                 Vagina: normal appearing vagina with normal color and discharge, no lesions              Cervix: no lesions               Bimanual Exam:  Uterus:  no masses or tenderness              Adnexa: no mass, fullness, tenderness               Rectovaginal: Confirms               Anus:  normal sphincter tone, no lesions  Chaperone was present for exam.  A:  Well Woman with normal exam  BMI 35, down 13 lbs since last year  Doing  well on OCP's  P:   Mammogram  No pap this year  Screening labs, vit d, HgbA1C, TSH  Continue OCP's  Discussed breast self exam  Discussed calcium and vit D intake

## 2019-03-04 ENCOUNTER — Encounter: Payer: Self-pay | Admitting: Obstetrics and Gynecology

## 2019-03-04 ENCOUNTER — Other Ambulatory Visit: Payer: Self-pay

## 2019-03-04 ENCOUNTER — Ambulatory Visit: Payer: 59 | Admitting: Obstetrics and Gynecology

## 2019-03-04 VITALS — BP 118/78 | HR 64 | Temp 97.2°F | Ht 71.0 in | Wt 253.6 lb

## 2019-03-04 DIAGNOSIS — E559 Vitamin D deficiency, unspecified: Secondary | ICD-10-CM | POA: Diagnosis not present

## 2019-03-04 DIAGNOSIS — Z0000 Encounter for general adult medical examination without abnormal findings: Secondary | ICD-10-CM

## 2019-03-04 DIAGNOSIS — Z3041 Encounter for surveillance of contraceptive pills: Secondary | ICD-10-CM | POA: Diagnosis not present

## 2019-03-04 DIAGNOSIS — Z6835 Body mass index (BMI) 35.0-35.9, adult: Secondary | ICD-10-CM

## 2019-03-04 DIAGNOSIS — Z833 Family history of diabetes mellitus: Secondary | ICD-10-CM

## 2019-03-04 DIAGNOSIS — Z01419 Encounter for gynecological examination (general) (routine) without abnormal findings: Secondary | ICD-10-CM

## 2019-03-04 MED ORDER — NORETHINDRONE ACET-ETHINYL EST 1-20 MG-MCG PO TABS: 1.0000 | ORAL_TABLET | Freq: Every day | ORAL | 3 refills | Status: DC

## 2019-03-04 NOTE — Patient Instructions (Signed)

## 2019-03-05 LAB — COMPREHENSIVE METABOLIC PANEL
ALT: 37 IU/L — ABNORMAL HIGH (ref 0–32)
AST: 23 IU/L (ref 0–40)
Albumin/Globulin Ratio: 1.7 (ref 1.2–2.2)
Albumin: 4.4 g/dL (ref 3.8–4.8)
Alkaline Phosphatase: 68 IU/L (ref 39–117)
BUN/Creatinine Ratio: 22 (ref 9–23)
BUN: 16 mg/dL (ref 6–24)
Bilirubin Total: 0.6 mg/dL (ref 0.0–1.2)
CO2: 19 mmol/L — ABNORMAL LOW (ref 20–29)
Calcium: 9.3 mg/dL (ref 8.7–10.2)
Chloride: 104 mmol/L (ref 96–106)
Creatinine, Ser: 0.72 mg/dL (ref 0.57–1.00)
GFR calc Af Amer: 121 mL/min/{1.73_m2} (ref >59)
GFR calc non Af Amer: 105 mL/min/{1.73_m2} (ref >59)
Globulin, Total: 2.6 g/dL (ref 1.5–4.5)
Glucose: 100 mg/dL — ABNORMAL HIGH (ref 65–99)
Potassium: 4.6 mmol/L (ref 3.5–5.2)
Sodium: 139 mmol/L (ref 134–144)
Total Protein: 7 g/dL (ref 6.0–8.5)

## 2019-03-05 LAB — CBC
Hematocrit: 41.4 % (ref 34.0–46.6)
Hemoglobin: 12.3 g/dL (ref 11.1–15.9)
MCH: 23.6 pg — ABNORMAL LOW (ref 26.6–33.0)
MCHC: 29.7 g/dL — ABNORMAL LOW (ref 31.5–35.7)
MCV: 79 fL (ref 79–97)
Platelets: 264 10*3/uL (ref 150–450)
RBC: 5.22 x10E6/uL (ref 3.77–5.28)
RDW: 15.5 % — ABNORMAL HIGH (ref 11.7–15.4)
WBC: 7 10*3/uL (ref 3.4–10.8)

## 2019-03-05 LAB — LIPID PANEL
Chol/HDL Ratio: 1.8 ratio (ref 0.0–4.4)
Cholesterol, Total: 130 mg/dL (ref 100–199)
HDL: 72 mg/dL (ref >39)
LDL Chol Calc (NIH): 45 mg/dL (ref 0–99)
Triglycerides: 60 mg/dL (ref 0–149)
VLDL Cholesterol Cal: 13 mg/dL (ref 5–40)

## 2019-03-05 LAB — VITAMIN D 25 HYDROXY (VIT D DEFICIENCY, FRACTURES): Vit D, 25-Hydroxy: 16.9 ng/mL — ABNORMAL LOW (ref 30.0–100.0)

## 2019-03-05 LAB — HEMOGLOBIN A1C
Est. average glucose Bld gHb Est-mCnc: 117 mg/dL
Hgb A1c MFr Bld: 5.7 % — ABNORMAL HIGH (ref 4.8–5.6)

## 2019-03-05 LAB — TSH: TSH: 1.04 u[IU]/mL (ref 0.450–4.500)

## 2019-03-09 ENCOUNTER — Encounter: Payer: Self-pay | Admitting: Obstetrics and Gynecology

## 2019-03-09 ENCOUNTER — Telehealth: Payer: Self-pay | Admitting: Obstetrics and Gynecology

## 2019-03-09 ENCOUNTER — Other Ambulatory Visit: Payer: Self-pay

## 2019-03-09 DIAGNOSIS — R7303 Prediabetes: Secondary | ICD-10-CM

## 2019-03-09 DIAGNOSIS — E559 Vitamin D deficiency, unspecified: Secondary | ICD-10-CM | POA: Insufficient documentation

## 2019-03-09 HISTORY — DX: Vitamin D deficiency, unspecified: E55.9

## 2019-03-09 HISTORY — DX: Prediabetes: R73.03

## 2019-03-09 NOTE — Telephone Encounter (Signed)
Patient is returning call to Kaitlyn.  

## 2019-03-09 NOTE — Telephone Encounter (Signed)
Spoke with patient. Patient is concerned about her elevated LFT. Patient states she has been working from home and is drinking daily due to stress with work. Very concerned this has caused her elevated liver test. Patient is asking to be written out of work to reduce stress and decrease drinking. Advised this is not something we would commonly do, but that I would review it with Dr.Jertson and return call with additional recommendations.

## 2019-03-09 NOTE — Telephone Encounter (Signed)
Please let her know that I'm sorry she is having such a hard time. It sounds like she needs a counselor and a primary MD. This is outside of my scope of practice. Please help facilitate these appointments.

## 2019-03-09 NOTE — Telephone Encounter (Signed)
Spoke with patient. Patient verbalizes understanding. Spoke with Financial controller at [employer]. Virtual appointment made for 03/11/2019 at 11 am with Dr.Parker. Patient is agreeable to date and time. Information provided for [facility] [employer] [facility] for counseling. Patient will call to schedule an appointment.  Routing to provider and will close encounter.

## 2019-03-11 ENCOUNTER — Ambulatory Visit (INDEPENDENT_AMBULATORY_CARE_PROVIDER_SITE_OTHER): Payer: 59 | Admitting: Family Medicine

## 2019-03-11 DIAGNOSIS — R7303 Prediabetes: Secondary | ICD-10-CM

## 2019-03-11 DIAGNOSIS — F439 Reaction to severe stress, unspecified: Secondary | ICD-10-CM | POA: Diagnosis not present

## 2019-03-11 DIAGNOSIS — E559 Vitamin D deficiency, unspecified: Secondary | ICD-10-CM | POA: Diagnosis not present

## 2019-03-11 MED ORDER — CITALOPRAM HYDROBROMIDE 20 MG PO TABS: 20.0000 mg | ORAL_TABLET | Freq: Every day | ORAL | 3 refills | Status: DC

## 2019-03-11 NOTE — Assessment & Plan Note (Signed)
Had lengthy discussion with patient regarding treatment options.  Her stress is precipitated by COVID-19 pandemic and changes at work.  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  We will start Celexa 20 mg daily.  Patient will work on cutting down on alcoholic beverages-she seems to be very motivated and I think she will do well with this. Offered referral for therapy however patient declined.  She will follow-up with me in 1 month.  Reassured patient regarding her slightly abnormal labs.

## 2019-03-11 NOTE — Progress Notes (Signed)
Chief Complaint:  Sierra [name] is a 38 y.o. female who presents today for a virtual office visit with a chief complaint of anxiety and to establish care.   Assessment/Plan:  Stress Had lengthy discussion with patient regarding treatment options.  Her stress is precipitated by COVID-19 pandemic and changes at work.  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  We will start Celexa 20 mg daily.  Patient will work on cutting down on alcoholic beverages-she seems to be very motivated and I think she will do well with this. Offered referral for therapy however patient declined.  She will follow-up with me in 1 month.  Reassured patient regarding her slightly abnormal labs.  Vitamin D deficiency Continue vitamin D supplementation.  Recheck with next blood draw.  Prediabetes Last A1c 5.7.  Fastin glucose 100.  She is working on lifestyle modifications.  Reassured patient that her numbers were very close to normal.  Recheck with next blood draw.     Subjective:  HPI:  Stress/anxiety Patient recently met with her OB/GYN.  She was told that she had elevated liver numbers and was prediabetic.  This caused a significant amount of stress anxiety.  Patient is also been under increased stress over the last several months due to changes at her work in the COVID-19 pandemic.  She is now working in a new department at work which is overwhelming to her.  She has never had any issues with depression, anxiety, or stress in the past.  She has been managing her stress by drinking more alcohol.  She is now drinking about 3 drinks per day.  She recognizes that this is too much and would like to cut back.  No reported SI or HI.  She has not tried anything for stress or anxiety in the past.  No other obvious aggravating or alleviating factors.  ROS: Per HPI, otherwise a complete review of systems was negative.   PMH:  The following were reviewed and entered/updated in epic: Past Medical History:   Diagnosis Date  . Family history of diabetes mellitus (DM)   . History of vitamin D deficiency   . Prediabetes 03/09/2019  . Vitamin D deficiency 03/09/2019   Patient Active Problem List   Diagnosis Date Noted  . Stress 03/11/2019  . Prediabetes 03/09/2019  . Vitamin D deficiency 03/09/2019   No past surgical history on file.  Family History  Problem Relation Age of Onset  . Diabetes Father        type 2  . Cancer Maternal Grandmother 32       related to smoking, unsure type ? colon    Medications- reviewed and updated Current Outpatient Medications  Medication Sig Dispense Refill  . norethindrone-ethinyl estradiol (JUNEL 1/20) 1-20 MG-MCG tablet Take 1 tablet by mouth daily. 3 Package 3  . phentermine 37.5 MG capsule Take 37.5 mg by mouth every morning.    . citalopram (CELEXA) 20 MG tablet Take 1 tablet (20 mg total) by mouth daily. 30 tablet 3   No current facility-administered medications for this visit.     Allergies-reviewed and updated No Known Allergies  Social History   Socioeconomic History  . Marital status: Single    Spouse name: Not on file  . Number of children: 0  . Years of education: Not on file  . Highest education level: Not on file  Occupational History    Employer: [employer]  Social Needs  . Financial resource strain: Not  on file  . Food insecurity    Worry: Not on file    Inability: Not on file  . Transportation needs    Medical: Not on file    Non-medical: Not on file  Tobacco Use  . Smoking status: Never Smoker  . Smokeless tobacco: Never Used  Substance and Sexual Activity  . Alcohol use: Yes    Alcohol/week: 3.0 standard drinks    Types: 3 Glasses of wine per week    Comment: wine and occ mixed drink  . Drug use: No  . Sexual activity: Yes    Partners: Male    Birth control/protection: Pill  Lifestyle  . Physical activity    Days per week: Not on file    Minutes per session: Not on file  . Stress: Not on file   Relationships  . Social Herbalist on phone: Not on file    Gets together: Not on file    Attends religious service: Not on file    Active member of club or organization: Not on file    Attends meetings of clubs or organizations: Not on file    Relationship status: Not on file  Other Topics Concern  . Not on file  Social History Narrative   ** Merged History Encounter **            Objective/Observations  Physical Exam: Gen: NAD, resting comfortably HEENT: Extraocular eye movements intact.  No scleral icterus Cardiovascular: No peripheral edema Pulm: Normal work of breathing MSK: Moves upper extremities with full range of motion.  No digital cyanosis Skin: No rashes or lesions noted. Neuro: Grossly normal, moves all extremities Psych: Normal affect and thought content  Virtual Visit via Video   I connected with Sierra [name] on 03/11/19 at 11:00 AM EDT by a video enabled telemedicine application and verified that I am speaking with the correct person using two identifiers. I discussed the limitations of evaluation and management by telemedicine and the availability of in person appointments. The patient expressed understanding and agreed to proceed.   Patient location: Home Provider location: [place] participating in the virtual visit: Myself and Patient     Algis Greenhouse. Jerline Pain, MD 03/11/2019 10:17 AM

## 2019-03-11 NOTE — Assessment & Plan Note (Signed)
Continue vitamin D supplementation.  Recheck with next blood draw.

## 2019-03-11 NOTE — Assessment & Plan Note (Addendum)
Last A1c 5.7.  Fastin glucose 100.  She is working on lifestyle modifications.  Reassured patient that her numbers were very close to normal.  Recheck with next blood draw.

## 2019-03-12 ENCOUNTER — Encounter: Payer: Self-pay | Admitting: Family Medicine

## 2019-03-23 ENCOUNTER — Other Ambulatory Visit: Payer: Self-pay | Admitting: Obstetrics and Gynecology

## 2019-03-23 DIAGNOSIS — Z1231 Encounter for screening mammogram for malignant neoplasm of breast: Secondary | ICD-10-CM

## 2019-04-02 ENCOUNTER — Other Ambulatory Visit: Payer: Self-pay | Admitting: Family Medicine

## 2019-04-13 ENCOUNTER — Other Ambulatory Visit: Payer: Self-pay

## 2019-04-13 ENCOUNTER — Ambulatory Visit: Payer: 59 | Admitting: Family Medicine

## 2019-04-13 ENCOUNTER — Encounter: Payer: Self-pay | Admitting: Family Medicine

## 2019-04-13 VITALS — BP 124/70 | HR 93 | Temp 98.4°F | Ht 71.0 in | Wt 256.4 lb

## 2019-04-13 DIAGNOSIS — F439 Reaction to severe stress, unspecified: Secondary | ICD-10-CM

## 2019-04-13 DIAGNOSIS — F1011 Alcohol abuse, in remission: Secondary | ICD-10-CM | POA: Diagnosis not present

## 2019-04-13 NOTE — Progress Notes (Signed)
   Chief Complaint:  Sierra [name] is a 38 y.o. female who presents today with a chief complaint of stress follow up.   Assessment/Plan:  Stress Doing much better.  We will continue Celexa 20 mg daily.  She will follow-up with me in 6 to 12 months.  Alcohol Use Disorder No longer drinking alcohol daily. Encouraged continued cessation.    Subjective:  HPI:  Stress  Seen a month ago. Started celexa 20mg  daily.  She has tolerated well without side effects.  No reported SI or HI.  She has cut down on alcohol use.  She is no longer drinking alcohol every day.  She is going back to work for the first time today.  She thinks will go well.  ROS: Per HPI  PMH: She reports that she has never smoked. She has never used smokeless tobacco. She reports current alcohol use of about 3.0 standard drinks of alcohol per week. She reports that she does not use drugs.      Objective:  Physical Exam: BP 124/70   Pulse 93   Temp 98.4 F (36.9 C)   Ht 5\' 11"  (1.803 m)   Wt 256 lb 6.4 oz (116.3 kg)   SpO2 96%   BMI 35.76 kg/m   Gen: NAD, resting comfortably CV: Regular rate and rhythm with no murmurs appreciated Pulm: Normal work of breathing, clear to auscultation bilaterally with no crackles, wheezes, or rhonchi GI: Normal bowel sounds present. Soft, Nontender, Nondistended. MSK: No edema, cyanosis, or clubbing noted Skin: Warm, dry Neuro: Grossly normal, moves all extremities Psych: Normal affect and thought content  No results found for this or any previous visit (from the past 24 hour(s)).      Algis Greenhouse. Jerline Pain, MD 04/13/2019 9:49 AM

## 2019-04-13 NOTE — Assessment & Plan Note (Signed)
Doing much better.  We will continue Celexa 20 mg daily.  She will follow-up with me in 6 to 12 months.

## 2019-04-13 NOTE — Patient Instructions (Signed)
It was very nice to see you today!  I am glad that you are doing well!  Come back to see me in 6-12 months, or sooner if needed.   Take care, Dr Jerline Pain  Please try these tips to maintain a healthy lifestyle:   Eat at least 3 REAL meals and 1-2 snacks per day.  Aim for no more than 5 hours between eating.  If you eat breakfast, please do so within one hour of getting up.    Obtain twice as many fruits/vegetables as protein or carbohydrate foods for both lunch and dinner. (Half of each meal should be fruits/vegetables, one quarter protein, and one quarter starchy carbs)   Cut down on sweet beverages. This includes juice, soda, and sweet tea.    Exercise at least 150 minutes every week.

## 2019-05-14 ENCOUNTER — Ambulatory Visit: Payer: 59

## 2019-05-18 ENCOUNTER — Other Ambulatory Visit: Payer: Self-pay

## 2019-05-18 ENCOUNTER — Ambulatory Visit
Admission: RE | Admit: 2019-05-18 | Discharge: 2019-05-18 | Disposition: A | Discharge status: Home / Self Care | Payer: Self-pay | Source: Ambulatory Visit | Attending: Obstetrics and Gynecology | Admitting: Obstetrics and Gynecology

## 2019-05-18 DIAGNOSIS — Z1231 Encounter for screening mammogram for malignant neoplasm of breast: Secondary | ICD-10-CM

## 2019-06-09 ENCOUNTER — Encounter: Payer: Self-pay | Admitting: Obstetrics and Gynecology

## 2019-06-09 ENCOUNTER — Other Ambulatory Visit (INDEPENDENT_AMBULATORY_CARE_PROVIDER_SITE_OTHER): Payer: BC Managed Care – PPO

## 2019-06-09 ENCOUNTER — Other Ambulatory Visit: Payer: Self-pay | Admitting: *Deleted

## 2019-06-09 ENCOUNTER — Other Ambulatory Visit: Payer: Self-pay

## 2019-06-09 DIAGNOSIS — R748 Abnormal levels of other serum enzymes: Secondary | ICD-10-CM

## 2019-06-09 DIAGNOSIS — E559 Vitamin D deficiency, unspecified: Secondary | ICD-10-CM

## 2019-06-10 LAB — HEPATIC FUNCTION PANEL
ALT: 30 IU/L (ref 0–32)
AST: 20 IU/L (ref 0–40)
Albumin: 4.5 g/dL (ref 3.8–4.8)
Alkaline Phosphatase: 60 IU/L (ref 39–117)
Bilirubin Total: 0.4 mg/dL (ref 0.0–1.2)
Bilirubin, Direct: 0.14 mg/dL (ref 0.00–0.40)
Total Protein: 6.6 g/dL (ref 6.0–8.5)

## 2019-06-10 LAB — VITAMIN D 25 HYDROXY (VIT D DEFICIENCY, FRACTURES): Vit D, 25-Hydroxy: 22 ng/mL — ABNORMAL LOW (ref 30.0–100.0)

## 2019-06-11 ENCOUNTER — Telehealth: Payer: Self-pay

## 2019-06-11 DIAGNOSIS — E559 Vitamin D deficiency, unspecified: Secondary | ICD-10-CM

## 2019-06-11 NOTE — Telephone Encounter (Signed)
Spoke with patient. She states that she has not been taking the vitamin D3 5,000 IU Daily except for just the last 2 two weeks. Follow up lab appointment made for March 16. Vitamin D ordered for future

## 2019-07-07 ENCOUNTER — Other Ambulatory Visit: Payer: Self-pay

## 2019-07-07 MED ORDER — CITALOPRAM HYDROBROMIDE 20 MG PO TABS: 20.0000 mg | ORAL_TABLET | Freq: Every day | ORAL | 0 refills | Status: DC

## 2019-07-28 ENCOUNTER — Other Ambulatory Visit: Payer: Self-pay

## 2019-11-24 ENCOUNTER — Other Ambulatory Visit: Payer: Self-pay | Admitting: Obstetrics and Gynecology

## 2019-12-29 DIAGNOSIS — Z79899 Other long term (current) drug therapy: Secondary | ICD-10-CM | POA: Diagnosis not present

## 2019-12-29 DIAGNOSIS — Z1159 Encounter for screening for other viral diseases: Secondary | ICD-10-CM | POA: Diagnosis not present

## 2019-12-29 DIAGNOSIS — R0602 Shortness of breath: Secondary | ICD-10-CM | POA: Diagnosis not present

## 2020-01-19 ENCOUNTER — Other Ambulatory Visit: Payer: Self-pay

## 2020-01-19 ENCOUNTER — Encounter: Payer: Self-pay | Admitting: Obstetrics and Gynecology

## 2020-01-19 MED ORDER — NORETHINDRONE ACET-ETHINYL EST 1-20 MG-MCG PO TABS: 1.0000 | ORAL_TABLET | Freq: Every day | ORAL | 0 refills | Status: DC

## 2020-01-19 NOTE — Telephone Encounter (Signed)
Pt sent following mychart message:   Sierra, [name] [facility] Good morning, I'm on my last week of birth control and my appointment is not until next month. I think my pills got off track last year during Covid when my appointment changed. Please call in a prescription to [facility] on [address].   Thank you

## 2020-01-19 NOTE — Telephone Encounter (Signed)
Med refill request: Junel 1/20 Last AEX: 03/04/2019 Next AEX: 03/09/20  Last MMG (if hormonal med)  05/18/19 Birads 1, neg   Refill authorized: # 28, [id number]. Pended if approved.   Routing to Dr Oscar La

## 2020-01-26 DIAGNOSIS — R5383 Other fatigue: Secondary | ICD-10-CM | POA: Diagnosis not present

## 2020-01-26 DIAGNOSIS — E669 Obesity, unspecified: Secondary | ICD-10-CM | POA: Diagnosis not present

## 2020-01-26 DIAGNOSIS — E7800 Pure hypercholesterolemia, unspecified: Secondary | ICD-10-CM | POA: Diagnosis not present

## 2020-01-26 DIAGNOSIS — R0602 Shortness of breath: Secondary | ICD-10-CM | POA: Diagnosis not present

## 2020-01-26 DIAGNOSIS — Z20822 Contact with and (suspected) exposure to covid-19: Secondary | ICD-10-CM | POA: Diagnosis not present

## 2020-01-26 DIAGNOSIS — R635 Abnormal weight gain: Secondary | ICD-10-CM | POA: Diagnosis not present

## 2020-01-26 DIAGNOSIS — Z79899 Other long term (current) drug therapy: Secondary | ICD-10-CM | POA: Diagnosis not present

## 2020-01-26 DIAGNOSIS — E559 Vitamin D deficiency, unspecified: Secondary | ICD-10-CM | POA: Diagnosis not present

## 2020-02-23 DIAGNOSIS — Z1159 Encounter for screening for other viral diseases: Secondary | ICD-10-CM | POA: Diagnosis not present

## 2020-02-23 DIAGNOSIS — R635 Abnormal weight gain: Secondary | ICD-10-CM | POA: Diagnosis not present

## 2020-02-23 DIAGNOSIS — Z79899 Other long term (current) drug therapy: Secondary | ICD-10-CM | POA: Diagnosis not present

## 2020-02-23 DIAGNOSIS — E669 Obesity, unspecified: Secondary | ICD-10-CM | POA: Diagnosis not present

## 2020-02-27 ENCOUNTER — Ambulatory Visit: Payer: BC Managed Care – PPO | Attending: Internal Medicine

## 2020-02-27 ENCOUNTER — Other Ambulatory Visit: Payer: Self-pay

## 2020-02-27 DIAGNOSIS — Z23 Encounter for immunization: Secondary | ICD-10-CM

## 2020-02-27 NOTE — Progress Notes (Signed)
   Covid-19 Vaccination Clinic  Name:  Sierra [name]    MRN: [id number] DOB: June 29, 1980  02/27/2020  [name] was observed post Covid-19 immunization for 15 minutes without incident. She was provided with Vaccine Information Sheet and instruction to access the V-Safe system.   [name] was instructed to call 911 with any severe reactions post vaccine: Marland Kitchen Difficulty breathing  . Swelling of face and throat  . A fast heartbeat  . A bad rash all over body  . Dizziness and weakness   Immunizations Administered    Name Date Dose VIS Date Route   Pfizer COVID-19 Vaccine 02/27/2020 12:31 PM 0.3 mL 07/08/2018 Intramuscular   Manufacturer: [employer], [employer]   Lot: [id number]   NDC: [address]-7

## 2020-03-07 NOTE — Progress Notes (Signed)
39 y.o. [id number] Single Black or African American Not Hispanic or Latino female here for annual exam.  Same long term partner, no dyspareunia.  Period Cycle (Days): 28 Period Duration (Days): 5 Period Pattern: Regular Menstrual Flow: Moderate, Light Menstrual Control: Tampon, Thin pad Menstrual Control Change Freq (Hours): 4-6 Dysmenorrhea: None   Struggles with her weight. Exercises a lot, but over eats.   Her mother got covid right after her vaccination and was very sick. Grandmother had a stroke after her covid vaccination. She has been worried about getting the vaccination, she did get her first covid vaccination.   Patient's last menstrual period was 02/10/2020 (within days).          Sexually active: Yes.    The current method of family planning is OCP (estrogen/progesterone).    Exercising: Yes.    walking Smoker:  no  Health Maintenance: Pap: 01/28/2018 WNL NEG HPV, 2016 negative, negative hpv  History of abnormal Pap:  no MMG:  05/18/19 Density C Bi-rads 1 Neg  BMD:   NA Colonoscopy: NA  TDaP:  01/02/17  Gardasil: never, discussed. She will consider for next year.     reports that she has never smoked. She has never used smokeless tobacco. She reports current alcohol use of about 3.0 standard drinks of alcohol per week. She reports that she does not use drugs. She works in [employer] in [employer].  Past Medical History:  Diagnosis Date  . Family history of diabetes mellitus (DM)   . History of vitamin D deficiency   . Prediabetes 03/09/2019  . Prediabetes   . Vitamin D deficiency 03/09/2019    History reviewed. No pertinent surgical history.  Current Outpatient Medications  Medication Sig Dispense Refill  . norethindrone-ethinyl estradiol (JUNEL 1/20) 1-20 MG-MCG tablet Take 1 tablet by mouth daily. 84 tablet 0  . phentermine 37.5 MG capsule Take 37.5 mg by mouth every morning.    . Vitamin D, Ergocalciferol, (DRISDOL) 1.25 MG (50000 UNIT) CAPS capsule Take 50,000  Units by mouth once a week.     No current facility-administered medications for this visit.    Family History  Problem Relation Age of Onset  . Diabetes Father        type 2  . Cancer Maternal Grandmother 22       related to smoking, unsure type ? colon    Review of Systems  Constitutional: Negative.   HENT: Negative.   Eyes: Negative.   Respiratory: Negative.   Cardiovascular: Negative.   Gastrointestinal: Negative.   Endocrine: Negative.   Genitourinary: Negative.   Musculoskeletal: Negative.   Skin: Negative.   Allergic/Immunologic: Negative.   Neurological: Negative.   Hematological: Negative.   Psychiatric/Behavioral: Negative.     Exam:   BP 130/72 (BP Location: Right Arm, Patient Position: Sitting, Cuff Size: Normal)   Pulse (!) 104   Ht 5' 11.25" (1.81 m)   Wt 247 lb (112 kg)   LMP 02/10/2020 (Within Days)   SpO2 97%   BMI 34.21 kg/m   Weight change: @WEIGHTCHANGE @ Height:   Height: 5' 11.25" (181 cm)  Ht Readings from Last 3 Encounters:  03/09/20 5' 11.25" (1.81 m)  04/13/19 5\' 11"  (1.803 m)  03/04/19 5\' 11"  (1.803 m)    General appearance: alert, cooperative and appears stated age Head: Normocephalic, without obvious abnormality, atraumatic Neck: no adenopathy, supple, symmetrical, trachea midline and thyroid normal to inspection and palpation Lungs: clear to auscultation bilaterally Cardiovascular: regular rate and rhythm Breasts:  normal appearance, no masses or tenderness Abdomen: soft, non-tender; non distended,  no masses,  no organomegaly Extremities: extremities normal, atraumatic, no cyanosis or edema Skin: Skin color, texture, turgor normal. No rashes or lesions Lymph nodes: Cervical, supraclavicular, and axillary nodes normal. No abnormal inguinal nodes palpated Neurologic: Grossly normal   Pelvic: External genitalia:  no lesions              Urethra:  normal appearing urethra with no masses, tenderness or lesions               Bartholins and Skenes: normal                 Vagina: normal appearing vagina with normal color and discharge, no lesions              Cervix: no lesions               Bimanual Exam:  Uterus:  normal size, contour, position, consistency, mobility, non-tender              Adnexa: no mass, fullness, tenderness               Rectovaginal: Confirms               Anus:  normal sphincter tone, no lesions  Arnold Long chaperoned for the exam.  A:  Well Woman with normal exam  BMI 34  Vit d def, she was off of her supplements for a couple of months, just restarted it a week ago.   Prediabetes  P:   No pap this year  Discussed breast self exam  Discussed calcium and vit D intake  Discussed weight loss  Screening labs, HgbA1C, TSH  Continue OCP's  Discussed gardasil, she will consider for next year

## 2020-03-09 ENCOUNTER — Encounter: Payer: Self-pay | Admitting: Obstetrics and Gynecology

## 2020-03-09 ENCOUNTER — Ambulatory Visit: Payer: BC Managed Care – PPO | Admitting: Obstetrics and Gynecology

## 2020-03-09 ENCOUNTER — Other Ambulatory Visit: Payer: Self-pay | Admitting: Obstetrics and Gynecology

## 2020-03-09 ENCOUNTER — Other Ambulatory Visit: Payer: Self-pay

## 2020-03-09 VITALS — BP 130/72 | HR 104 | Ht 71.25 in | Wt 247.0 lb

## 2020-03-09 DIAGNOSIS — Z3041 Encounter for surveillance of contraceptive pills: Secondary | ICD-10-CM

## 2020-03-09 DIAGNOSIS — Z0000 Encounter for general adult medical examination without abnormal findings: Secondary | ICD-10-CM

## 2020-03-09 DIAGNOSIS — Z01419 Encounter for gynecological examination (general) (routine) without abnormal findings: Secondary | ICD-10-CM

## 2020-03-09 DIAGNOSIS — E559 Vitamin D deficiency, unspecified: Secondary | ICD-10-CM | POA: Diagnosis not present

## 2020-03-09 DIAGNOSIS — R7303 Prediabetes: Secondary | ICD-10-CM

## 2020-03-09 MED ORDER — NORETHINDRONE ACET-ETHINYL EST 1-20 MG-MCG PO TABS: 1.0000 | ORAL_TABLET | Freq: Every day | ORAL | 3 refills | Status: DC

## 2020-03-09 NOTE — Patient Instructions (Signed)
EXERCISE AND DIET:  We recommended that you start or continue a regular exercise program for good health. Regular exercise means any activity that makes your heart beat faster and makes you sweat.  We recommend exercising at least 30 minutes per day at least 3 days a week, preferably 4 or 5.  We also recommend a diet low in fat and sugar.  Inactivity, poor dietary choices and obesity can cause diabetes, heart attack, stroke, and kidney damage, among others.    ALCOHOL AND SMOKING:  Women should limit their alcohol intake to no more than 7 drinks/beers/glasses of wine (combined, not each!) per week. Moderation of alcohol intake to this level decreases your risk of breast cancer and liver damage. And of course, no recreational drugs are part of a healthy lifestyle.  And absolutely no smoking or even second hand smoke. Most people know smoking can cause heart and lung diseases, but did you know it also contributes to weakening of your bones? Aging of your skin?  Yellowing of your teeth and nails?  CALCIUM AND VITAMIN D:  Adequate intake of calcium and Vitamin D are recommended.  The recommendations for exact amounts of these supplements seem to change often, but generally speaking 1,000 mg of calcium (between diet and supplement) and 800 units of Vitamin D per day seems prudent. Certain women may benefit from higher intake of Vitamin D.  If you are among these women, your doctor will have told you during your visit.    PAP SMEARS:  Pap smears, to check for cervical cancer or precancers,  have traditionally been done yearly, although recent scientific advances have shown that most women can have pap smears less often.  However, every woman still should have a physical exam from her gynecologist every year. It will include a breast check, inspection of the vulva and vagina to check for abnormal growths or skin changes, a visual exam of the cervix, and then an exam to evaluate the size and shape of the uterus and  ovaries.  And after 40 years of age, a rectal exam is indicated to check for rectal cancers. We will also provide age appropriate advice regarding health maintenance, like when you should have certain vaccines, screening for sexually transmitted diseases, bone density testing, colonoscopy, mammograms, etc.   MAMMOGRAMS:  All women over 40 years old should have a yearly mammogram. Many facilities now offer a "3D" mammogram, which may cost around $50 extra out of pocket. If possible,  we recommend you accept the option to have the 3D mammogram performed.  It both reduces the number of women who will be called back for extra views which then turn out to be normal, and it is better than the routine mammogram at detecting truly abnormal areas.    COLON CANCER SCREENING: Now recommend starting at age 45. At this time colonoscopy is not covered for routine screening until 50. There are take home tests that can be done between 45-49.   COLONOSCOPY:  Colonoscopy to screen for colon cancer is recommended for all women at age 50.  We know, you hate the idea of the prep.  We agree, BUT, having colon cancer and not knowing it is worse!!  Colon cancer so often starts as a polyp that can be seen and removed at colonscopy, which can quite literally save your life!  And if your first colonoscopy is normal and you have no family history of colon cancer, most women don't have to have it again for   10 years.  Once every ten years, you can do something that may end up saving your life, right?  We will be happy to help you get it scheduled when you are ready.  Be sure to check your insurance coverage so you understand how much it will cost.  It may be covered as a preventative service at no cost, but you should check your particular policy.      Breast Self-Awareness Breast self-awareness means being familiar with how your breasts look and feel. It involves checking your breasts regularly and reporting any changes to your  health care provider. Practicing breast self-awareness is important. A change in your breasts can be a sign of a serious medical problem. Being familiar with how your breasts look and feel allows you to find any problems early, when treatment is more likely to be successful. All women should practice breast self-awareness, including women who have had breast implants. How to do a breast self-exam One way to learn what is normal for your breasts and whether your breasts are changing is to do a breast self-exam. To do a breast self-exam: Look for Changes  1. Remove all the clothing above your waist. 2. Stand in front of a mirror in a room with good lighting. 3. Put your hands on your hips. 4. Push your hands firmly downward. 5. Compare your breasts in the mirror. Look for differences between them (asymmetry), such as: ? Differences in shape. ? Differences in size. ? Puckers, dips, and bumps in one breast and not the other. 6. Look at each breast for changes in your skin, such as: ? Redness. ? Scaly areas. 7. Look for changes in your nipples, such as: ? Discharge. ? Bleeding. ? Dimpling. ? Redness. ? A change in position. Feel for Changes Carefully feel your breasts for lumps and changes. It is best to do this while lying on your back on the floor and again while sitting or standing in the shower or tub with soapy water on your skin. Feel each breast in the following way:  Place the arm on the side of the breast you are examining above your head.  Feel your breast with the other hand.  Start in the nipple area and make  inch (2 cm) overlapping circles to feel your breast. Use the pads of your three middle fingers to do this. Apply light pressure, then medium pressure, then firm pressure. The light pressure will allow you to feel the tissue closest to the skin. The medium pressure will allow you to feel the tissue that is a little deeper. The firm pressure will allow you to feel the tissue  close to the ribs.  Continue the overlapping circles, moving downward over the breast until you feel your ribs below your breast.  Move one finger-width toward the center of the body. Continue to use the  inch (2 cm) overlapping circles to feel your breast as you move slowly up toward your collarbone.  Continue the up and down exam using all three pressures until you reach your armpit.  Write Down What You Find  Write down what is normal for each breast and any changes that you find. Keep a written record with breast changes or normal findings for each breast. By writing this information down, you do not need to depend only on memory for size, tenderness, or location. Write down where you are in your menstrual cycle, if you are still menstruating. If you are having trouble noticing differences   in your breasts, do not get discouraged. With time you will become more familiar with the variations in your breasts and more comfortable with the exam. How often should I examine my breasts? Examine your breasts every month. If you are breastfeeding, the best time to examine your breasts is after a feeding or after using a breast pump. If you menstruate, the best time to examine your breasts is 5-7 days after your period is over. During your period, your breasts are lumpier, and it may be more difficult to notice changes. When should I see my health care provider? See your health care provider if you notice:  A change in shape or size of your breasts or nipples.  A change in the skin of your breast or nipples, such as a reddened or scaly area.  Unusual discharge from your nipples.  A lump or thick area that was not there before.  Pain in your breasts.  Anything that concerns you. 2

## 2020-03-10 LAB — COMPREHENSIVE METABOLIC PANEL
ALT: 34 IU/L — ABNORMAL HIGH (ref 0–32)
AST: 23 IU/L (ref 0–40)
Albumin/Globulin Ratio: 1.8 (ref 1.2–2.2)
Albumin: 4.6 g/dL (ref 3.8–4.8)
Alkaline Phosphatase: 56 IU/L (ref 44–121)
BUN/Creatinine Ratio: 14 (ref 9–23)
BUN: 10 mg/dL (ref 6–24)
Bilirubin Total: 0.4 mg/dL (ref 0.0–1.2)
CO2: 21 mmol/L (ref 20–29)
Calcium: 9.5 mg/dL (ref 8.7–10.2)
Chloride: 100 mmol/L (ref 96–106)
Creatinine, Ser: 0.73 mg/dL (ref 0.57–1.00)
GFR calc Af Amer: 118 mL/min/{1.73_m2} (ref >59)
GFR calc non Af Amer: 103 mL/min/{1.73_m2} (ref >59)
Globulin, Total: 2.6 g/dL (ref 1.5–4.5)
Glucose: 93 mg/dL (ref 65–99)
Potassium: 4.5 mmol/L (ref 3.5–5.2)
Sodium: 137 mmol/L (ref 134–144)
Total Protein: 7.2 g/dL (ref 6.0–8.5)

## 2020-03-10 LAB — LIPID PANEL
Chol/HDL Ratio: 2.4 ratio (ref 0.0–4.4)
Cholesterol, Total: 153 mg/dL (ref 100–199)
HDL: 63 mg/dL (ref >39)
LDL Chol Calc (NIH): 71 mg/dL (ref 0–99)
Triglycerides: 106 mg/dL (ref 0–149)
VLDL Cholesterol Cal: 19 mg/dL (ref 5–40)

## 2020-03-10 LAB — CBC
Hematocrit: 40.7 % (ref 34.0–46.6)
Hemoglobin: 12.8 g/dL (ref 11.1–15.9)
MCH: 23.9 pg — ABNORMAL LOW (ref 26.6–33.0)
MCHC: 31.4 g/dL — ABNORMAL LOW (ref 31.5–35.7)
MCV: 76 fL — ABNORMAL LOW (ref 79–97)
Platelets: 282 10*3/uL (ref 150–450)
RBC: 5.36 x10E6/uL — ABNORMAL HIGH (ref 3.77–5.28)
RDW: 14 % (ref 11.7–15.4)
WBC: 6.7 10*3/uL (ref 3.4–10.8)

## 2020-03-10 LAB — HEMOGLOBIN A1C
Est. average glucose Bld gHb Est-mCnc: 114 mg/dL
Hgb A1c MFr Bld: 5.6 % (ref 4.8–5.6)

## 2020-03-10 LAB — TSH: TSH: 1.09 u[IU]/mL (ref 0.450–4.500)

## 2020-03-16 LAB — SPECIMEN STATUS REPORT

## 2020-03-16 LAB — VITAMIN D 25 HYDROXY (VIT D DEFICIENCY, FRACTURES): Vit D, 25-Hydroxy: 26.5 ng/mL — ABNORMAL LOW (ref 30.0–100.0)

## 2020-03-24 DIAGNOSIS — E669 Obesity, unspecified: Secondary | ICD-10-CM | POA: Diagnosis not present

## 2020-03-24 DIAGNOSIS — Z1159 Encounter for screening for other viral diseases: Secondary | ICD-10-CM | POA: Diagnosis not present

## 2020-03-24 DIAGNOSIS — Z20822 Contact with and (suspected) exposure to covid-19: Secondary | ICD-10-CM | POA: Diagnosis not present

## 2020-03-24 DIAGNOSIS — Z79899 Other long term (current) drug therapy: Secondary | ICD-10-CM | POA: Diagnosis not present

## 2020-03-24 DIAGNOSIS — R635 Abnormal weight gain: Secondary | ICD-10-CM | POA: Diagnosis not present

## 2020-03-26 ENCOUNTER — Ambulatory Visit: Payer: BC Managed Care – PPO | Attending: Internal Medicine

## 2020-03-26 ENCOUNTER — Other Ambulatory Visit: Payer: Self-pay

## 2020-03-26 DIAGNOSIS — Z23 Encounter for immunization: Secondary | ICD-10-CM

## 2020-03-26 NOTE — Progress Notes (Signed)
   Covid-19 Vaccination Clinic  Name:  ABENA ERDMAN    MRN: [id number] DOB: 1981-05-04  03/26/2020  Ms. Bittinger was observed post Covid-19 immunization for 15 minutes without incident. She was provided with Vaccine Information Sheet and instruction to access the V-Safe system.   Ms. Barbero was instructed to call 911 with any severe reactions post vaccine: Marland Kitchen Difficulty breathing  . Swelling of face and throat  . A fast heartbeat  . A bad rash all over body  . Dizziness and weakness   Immunizations Administered    Name Date Dose VIS Date Route   Pfizer COVID-19 Vaccine 03/26/2020 12:57 PM 0.3 mL 03/02/2020 Intramuscular   Manufacturer: [employer], [employer]   Lot: [id number]   NDC: [address]-8

## 2020-06-03 ENCOUNTER — Other Ambulatory Visit: Payer: Self-pay

## 2020-06-03 ENCOUNTER — Ambulatory Visit (HOSPITAL_COMMUNITY)
Admission: EM | Admit: 2020-06-03 | Discharge: 2020-06-03 | Disposition: A | Discharge status: Home / Self Care | Payer: BC Managed Care – PPO | Attending: Internal Medicine | Admitting: Internal Medicine

## 2020-06-03 ENCOUNTER — Encounter (HOSPITAL_COMMUNITY): Payer: Self-pay

## 2020-06-03 DIAGNOSIS — L0291 Cutaneous abscess, unspecified: Secondary | ICD-10-CM

## 2020-06-03 MED ORDER — DOXYCYCLINE HYCLATE 100 MG PO TABS: 100.0000 mg | ORAL_TABLET | Freq: Two times a day (BID) | ORAL | 0 refills | Status: AC

## 2020-06-03 NOTE — ED Triage Notes (Signed)
Pt presents with abscess in left groin area X 4 days.

## 2020-06-03 NOTE — ED Provider Notes (Signed)
[facility]    CSN: [id number] Arrival date & time: 06/03/20  4193      History   Chief Complaint Chief Complaint  Patient presents with   Abscess    HPI Sierra [name] is a 40 y.o. female.  Swelling and pain to her left groin area since Monday.  Area started draining last evening.  History of abscess and I&D in the past.  Denies any recent fever or chills.   Past Medical History:  Diagnosis Date   Family history of diabetes mellitus (DM)    History of vitamin D deficiency    Prediabetes 03/09/2019   Prediabetes    Vitamin D deficiency 03/09/2019    Patient Active Problem List   Diagnosis Date Noted   Stress 03/11/2019   Prediabetes 03/09/2019   Vitamin D deficiency 03/09/2019    History reviewed. No pertinent surgical history.  OB History    Gravida  5   Para  0   Term  0   Preterm  0   AB  5   Living  0     SAB  1   IAB  4   Ectopic  0   Multiple  0   Live Births  0            Home Medications    Prior to Admission medications   Medication Sig Start Date End Date Taking? Authorizing Provider  doxycycline (VIBRA-TABS) 100 MG tablet Take 1 tablet (100 mg total) by mouth 2 (two) times daily for 10 days. 06/03/20 06/13/20 Yes Rolla Etienne, NP  norethindrone-ethinyl estradiol (JUNEL 1/20) 1-20 MG-MCG tablet Take 1 tablet by mouth daily. 03/09/20   Romualdo Bolk, MD  phentermine 37.5 MG capsule Take 37.5 mg by mouth every morning.    [provider]  Vitamin D, Ergocalciferol, (DRISDOL) 1.25 MG (50000 UNIT) CAPS capsule Take 50,000 Units by mouth once a week. 02/23/20   [provider]    Family History Family History  Problem Relation Age of Onset   Diabetes Father        type 2   Cancer Maternal Grandmother 71       related to smoking, unsure type ? colon    Social History Social History   Tobacco Use   Smoking status: Never Smoker   Smokeless tobacco: Never Used  Vaping Use    Vaping Use: Never used  Substance Use Topics   Alcohol use: Yes    Alcohol/week: 3.0 standard drinks    Types: 3 Glasses of wine per week    Comment: wine and occ mixed drink   Drug use: No     Allergies   Patient has no known allergies.   Review of Systems As stated in HPI otherwise negative   Physical Exam Triage Vital Signs ED Triage Vitals  Enc Vitals Group     BP 06/03/20 0832 128/88     Pulse Rate 06/03/20 0832 (!) 103     Resp 06/03/20 0832 18     Temp 06/03/20 0832 98.6 F (37 C)     Temp Source 06/03/20 0832 Oral     SpO2 06/03/20 0832 98 %     Weight --      Height --      Head Circumference --      Peak Flow --      Pain Score 06/03/20 0834 8     Pain Loc --      Pain  Edu? --      Excl. in GC? --    No data found.  Updated Vital Signs BP 128/88 (BP Location: Left Arm)    Pulse (!) 103    Temp 98.6 F (37 C) (Oral)    Resp 18    LMP 05/07/2020    SpO2 98%   Visual Acuity Right Eye Distance:   Left Eye Distance:   Bilateral Distance:    Right Eye Near:   Left Eye Near:    Bilateral Near:     Physical Exam Constitutional:      General: She is not in acute distress.    Appearance: Normal appearance. She is not ill-appearing or toxic-appearing.  Skin:    General: Skin is warm and dry.          Comments: Abscess to left inguinal region with skin excoriation and actively draining.  Mild surrounding erythema  Neurological:     Mental Status: She is alert.  Psychiatric:        Mood and Affect: Mood normal.        Behavior: Behavior normal.      UC Treatments / Results  Labs (all labs ordered are listed, but only abnormal results are displayed) Labs Reviewed - No data to display  EKG   Radiology No results found.  Procedures Procedures (including critical care time)  Medications Ordered in UC Medications - No data to display  Initial Impression / Assessment and Plan / UC Course  I have reviewed the triage vital signs and the  nursing notes.  Pertinent labs & imaging results that were available during my care of the patient were reviewed by me and considered in my medical decision making (see chart for details).  Abscess, left inguinal Likely due to folliculitis.  No need for I&D as abscess is already draining well on its own.  Due to size of abscess and surrounding erythema we will treat empirically with p.o. Doxy.  Frequent warm compresses.  Return for any increased swelling, pain, fever or chills  Reviewed expections re: course of current medical issues. Questions answered. Outlined signs and symptoms indicating need for more acute intervention. Pt verbalized understanding. AVS given  Final Clinical Impressions(s) / UC Diagnoses   Final diagnoses:  Abscess     Discharge Instructions     Take antibiotic as prescribed until completely finished.  Frequent warm compresses as we discussed.  It may be beneficial to buy body wash with the salicylic acid as we discussed to help prevent future infections.  Please return for any  fever, worsening swelling or redness.    ED Prescriptions    Medication Sig Dispense Auth. Provider   doxycycline (VIBRA-TABS) 100 MG tablet Take 1 tablet (100 mg total) by mouth 2 (two) times daily for 10 days. 20 tablet Rolla Etienne, NP     PDMP not reviewed this encounter.   Rolla Etienne, NP 06/03/20 1148

## 2020-06-03 NOTE — Discharge Instructions (Addendum)
Take antibiotic as prescribed until completely finished.  Frequent warm compresses as we discussed.  It may be beneficial to buy body wash with the salicylic acid as we discussed to help prevent future infections.  Please return for any  fever, worsening swelling or redness.

## 2020-06-07 ENCOUNTER — Other Ambulatory Visit: Payer: Self-pay

## 2020-06-07 ENCOUNTER — Encounter: Payer: Self-pay | Admitting: Physician Assistant

## 2020-06-07 ENCOUNTER — Ambulatory Visit: Payer: BC Managed Care – PPO | Admitting: Physician Assistant

## 2020-06-07 VITALS — BP 110/78 | HR 100 | Temp 98.0°F | Ht 71.25 in | Wt 257.2 lb

## 2020-06-07 DIAGNOSIS — L0291 Cutaneous abscess, unspecified: Secondary | ICD-10-CM

## 2020-06-07 MED ORDER — IBUPROFEN 600 MG PO TABS: 600.0000 mg | ORAL_TABLET | Freq: Three times a day (TID) | ORAL | 0 refills | Status: DC | PRN

## 2020-06-07 MED ORDER — CEPHALEXIN 500 MG PO CAPS
500.0000 mg | ORAL_CAPSULE | Freq: Four times a day (QID) | ORAL | 0 refills | Status: DC
Start: 2020-06-07 — End: 2021-03-17

## 2020-06-07 NOTE — Patient Instructions (Signed)
It was great to see you!  Continue oral doxycycline. Start oral keflex antibiotic in addition to this.  Take oral ibuprofen for a few days to help calm down the inflammation.  Any worsening, call us and we will like get some imaging (such as  CT scan) for further evaluation.  Take care,  Jarold Motto PA-C

## 2020-06-07 NOTE — Progress Notes (Signed)
Sierra [name] is a 40 y.o. female here for a new problem.  I acted as a Neurosurgeon for [employer], PA-C Corky Mull, LPN   History of Present Illness:   Chief Complaint  Patient presents with  . Recurrent Skin Infections    HPI   Abscess Pt c/o abscess in her left upper inner thigh started on Tuesday last week. Pt went to Urgent care on Friday, the area opened and drained prior to her visit. She was given Doxycycline for 10 days, she is taking this as prescribed. She has history of these, typically in areas where she shaves. Typically gets them in her L armpit.  Pt is concerned that thigh is still swollen, area has reduced in tenderness, but remains quite red.  Denies fever, chills, malaise, n/v.   Past Medical History:  Diagnosis Date  . Family history of diabetes mellitus (DM)   . History of vitamin D deficiency   . Prediabetes 03/09/2019  . Prediabetes   . Vitamin D deficiency 03/09/2019     Social History   Tobacco Use  . Smoking status: Never Smoker  . Smokeless tobacco: Never Used  Vaping Use  . Vaping Use: Never used  Substance Use Topics  . Alcohol use: Yes    Alcohol/week: 3.0 standard drinks    Types: 3 Glasses of wine per week    Comment: wine and occ mixed drink  . Drug use: No    History reviewed. No pertinent surgical history.  Family History  Problem Relation Age of Onset  . Diabetes Father        type 2  . Cancer Maternal Grandmother 75       related to smoking, unsure type ? colon    No Known Allergies  Current Medications:   Current Outpatient Medications:  .  cephALEXin (KEFLEX) 500 MG capsule, Take 1 capsule (500 mg total) by mouth 4 (four) times daily., Disp: 28 capsule, Rfl: 0 .  doxycycline (VIBRA-TABS) 100 MG tablet, Take 1 tablet (100 mg total) by mouth 2 (two) times daily for 10 days., Disp: 20 tablet, Rfl: 0 .  ibuprofen (ADVIL) 600 MG tablet, Take 1 tablet (600 mg total) by mouth every 8 (eight) hours as needed., Disp: 30  tablet, Rfl: 0 .  norethindrone-ethinyl estradiol (JUNEL 1/20) 1-20 MG-MCG tablet, Take 1 tablet by mouth daily., Disp: 84 tablet, Rfl: 3 .  Vitamin D, Ergocalciferol, (DRISDOL) 1.25 MG (50000 UNIT) CAPS capsule, Take 50,000 Units by mouth once a week., Disp: , Rfl:    Review of Systems:   ROS  Negative unless otherwise specified per HPI.  Vitals:   Vitals:   06/07/20 0835  BP: 110/78  Pulse: 100  Temp: 98 F (36.7 C)  TempSrc: Temporal  SpO2: 96%  Weight: 257 lb 4 oz (116.7 kg)  Height: 5' 11.25" (1.81 m)     Body mass index is 35.63 kg/m.  Physical Exam:   Physical Exam Constitutional:      Appearance: She is well-developed and well-nourished.  HENT:     Head: Normocephalic and atraumatic.  Eyes:     Extraocular Movements: EOM normal.     Conjunctiva/sclera: Conjunctivae normal.  Pulmonary:     Effort: Pulmonary effort is normal.  Musculoskeletal:        General: Normal range of motion.     Cervical back: Normal range of motion and neck supple.  Skin:    General: Skin is warm and dry.     Comments:  L inner groin with 1 cm open draining lesion L upper inner thigh with significant swelling and erythema radiating out to upper third of left thigh; slight warmth to area; slightly indurated area to innermost erythematous area  Neurological:     Mental Status: She is alert and oriented to person, place, and time.  Psychiatric:        Mood and Affect: Mood and affect normal.        Behavior: Behavior normal.        Thought Content: Thought content normal.        Judgment: Judgment normal.     Assessment and Plan:   [name] was seen today for recurrent skin infections.  Diagnoses and all orders for this visit:  Abscess Vitals stable, no evidence of systemic spread of infection. Due to area of erythema/inflammation, will continue oral doxycycline and add on keflex 500 mg QID for additional coverage. Also start oral ibuprofen to help with inflammation. Worsening  precautions advised. She has scheduled follow-up with our office early next week to re-check. Discussed returning sooner if needed.  Other orders -     cephALEXin (KEFLEX) 500 MG capsule; Take 1 capsule (500 mg total) by mouth 4 (four) times daily. -     ibuprofen (ADVIL) 600 MG tablet; Take 1 tablet (600 mg total) by mouth every 8 (eight) hours as needed.  CMA or LPN served as scribe during this visit. History, Physical, and Plan performed by medical provider. The above documentation has been reviewed and is accurate and complete.  Jarold Motto, PA-C

## 2020-06-14 ENCOUNTER — Ambulatory Visit: Payer: BC Managed Care – PPO | Admitting: Physician Assistant

## 2020-06-21 ENCOUNTER — Other Ambulatory Visit: Payer: Self-pay

## 2020-08-10 DIAGNOSIS — E669 Obesity, unspecified: Secondary | ICD-10-CM | POA: Diagnosis not present

## 2020-08-10 DIAGNOSIS — E559 Vitamin D deficiency, unspecified: Secondary | ICD-10-CM | POA: Diagnosis not present

## 2020-08-10 DIAGNOSIS — R5383 Other fatigue: Secondary | ICD-10-CM | POA: Diagnosis not present

## 2020-08-10 DIAGNOSIS — R0602 Shortness of breath: Secondary | ICD-10-CM | POA: Diagnosis not present

## 2020-08-10 DIAGNOSIS — Z79899 Other long term (current) drug therapy: Secondary | ICD-10-CM | POA: Diagnosis not present

## 2020-08-10 DIAGNOSIS — R635 Abnormal weight gain: Secondary | ICD-10-CM | POA: Diagnosis not present

## 2020-09-07 DIAGNOSIS — R945 Abnormal results of liver function studies: Secondary | ICD-10-CM | POA: Diagnosis not present

## 2020-09-07 DIAGNOSIS — E669 Obesity, unspecified: Secondary | ICD-10-CM | POA: Diagnosis not present

## 2020-09-07 DIAGNOSIS — Z20822 Contact with and (suspected) exposure to covid-19: Secondary | ICD-10-CM | POA: Diagnosis not present

## 2020-09-07 DIAGNOSIS — R5383 Other fatigue: Secondary | ICD-10-CM | POA: Diagnosis not present

## 2020-09-07 DIAGNOSIS — R635 Abnormal weight gain: Secondary | ICD-10-CM | POA: Diagnosis not present

## 2020-09-07 DIAGNOSIS — Z79899 Other long term (current) drug therapy: Secondary | ICD-10-CM | POA: Diagnosis not present

## 2020-10-12 DIAGNOSIS — Z20822 Contact with and (suspected) exposure to covid-19: Secondary | ICD-10-CM | POA: Diagnosis not present

## 2020-11-17 DIAGNOSIS — Z20822 Contact with and (suspected) exposure to covid-19: Secondary | ICD-10-CM | POA: Diagnosis not present

## 2020-11-17 DIAGNOSIS — E669 Obesity, unspecified: Secondary | ICD-10-CM | POA: Diagnosis not present

## 2020-11-17 DIAGNOSIS — Z79899 Other long term (current) drug therapy: Secondary | ICD-10-CM | POA: Diagnosis not present

## 2020-11-17 DIAGNOSIS — R635 Abnormal weight gain: Secondary | ICD-10-CM | POA: Diagnosis not present

## 2020-11-17 DIAGNOSIS — E559 Vitamin D deficiency, unspecified: Secondary | ICD-10-CM | POA: Diagnosis not present

## 2020-11-17 DIAGNOSIS — R5383 Other fatigue: Secondary | ICD-10-CM | POA: Diagnosis not present

## 2020-12-11 DIAGNOSIS — E669 Obesity, unspecified: Secondary | ICD-10-CM | POA: Diagnosis not present

## 2020-12-11 DIAGNOSIS — R635 Abnormal weight gain: Secondary | ICD-10-CM | POA: Diagnosis not present

## 2020-12-11 DIAGNOSIS — Z79899 Other long term (current) drug therapy: Secondary | ICD-10-CM | POA: Diagnosis not present

## 2020-12-11 DIAGNOSIS — Z1159 Encounter for screening for other viral diseases: Secondary | ICD-10-CM | POA: Diagnosis not present

## 2020-12-11 DIAGNOSIS — E559 Vitamin D deficiency, unspecified: Secondary | ICD-10-CM | POA: Diagnosis not present

## 2021-03-02 ENCOUNTER — Other Ambulatory Visit: Payer: Self-pay | Admitting: Obstetrics and Gynecology

## 2021-03-02 DIAGNOSIS — Z1231 Encounter for screening mammogram for malignant neoplasm of breast: Secondary | ICD-10-CM

## 2021-03-02 NOTE — Telephone Encounter (Signed)
Annual exam scheduled on 03/16/21 Last mammogram was on 05/2019 Called patient and asked her to please call the breast center to update her mammogram. Patient verbalized she would.

## 2021-03-14 NOTE — Progress Notes (Deleted)
40 y.o. [id number] Single Black or African American Not Hispanic or Latino female here for annual exam.      No LMP recorded. (Menstrual status: Oral contraceptives).          Sexually active: {yes no:314532}  The current method of family planning is {contraception:315051}.    Exercising: {yes no:314532}  {types:19826} Smoker:  {YES [id number]  Health Maintenance: Pap:  01/28/2018 WNL NEG HPV, 2016 negative, negative hpv  History of abnormal Pap:  no MMG:  04/03/21 Bi-rads 1 neg  BMD:   n/a Colonoscopy: none  TDaP:  01/02/2017  Gardasil: never. Has been discussed.    reports that she has never smoked. She has never used smokeless tobacco. She reports current alcohol use of about 3.0 standard drinks per week. She reports that she does not use drugs.  Past Medical History:  Diagnosis Date   Family history of diabetes mellitus (DM)    History of vitamin D deficiency    Prediabetes 03/09/2019   Prediabetes    Vitamin D deficiency 03/09/2019    No past surgical history on file.  Current Outpatient Medications  Medication Sig Dispense Refill   cephALEXin (KEFLEX) 500 MG capsule Take 1 capsule (500 mg total) by mouth 4 (four) times daily. 28 capsule 0   ibuprofen (ADVIL) 600 MG tablet Take 1 tablet (600 mg total) by mouth every 8 (eight) hours as needed. 30 tablet 0   norethindrone-ethinyl estradiol (JUNEL 1/20) 1-20 MG-MCG tablet TAKE 1 TABLET BY MOUTH EVERY DAY 84 tablet 0   Vitamin D, Ergocalciferol, (DRISDOL) 1.25 MG (50000 UNIT) CAPS capsule Take 50,000 Units by mouth once a week.     No current facility-administered medications for this visit.    Family History  Problem Relation Age of Onset   Diabetes Father        type 2   Cancer Maternal Grandmother 65       related to smoking, unsure type ? colon    Review of Systems  Exam:   There were no vitals taken for this visit.  Weight change: @WEIGHTCHANGE @ Height:      Ht Readings from Last 3 Encounters:  06/07/20 5' 11.25"  (1.81 m)  03/09/20 5' 11.25" (1.81 m)  04/13/19 5\' 11"  (1.803 m)    General appearance: alert, cooperative and appears stated age Head: Normocephalic, without obvious abnormality, atraumatic Neck: no adenopathy, supple, symmetrical, trachea midline and thyroid {CHL AMB PHY EX THYROID NORM DEFAULT:[phone]::"normal to inspection and palpation"} Lungs: clear to auscultation bilaterally Cardiovascular: regular rate and rhythm Breasts: {Exam; breast:13139::"normal appearance, no masses or tenderness"} Abdomen: soft, non-tender; non distended,  no masses,  no organomegaly Extremities: extremities normal, atraumatic, no cyanosis or edema Skin: Skin color, texture, turgor normal. No rashes or lesions Lymph nodes: Cervical, supraclavicular, and axillary nodes normal. No abnormal inguinal nodes palpated Neurologic: Grossly normal   Pelvic: External genitalia:  no lesions              Urethra:  normal appearing urethra with no masses, tenderness or lesions              Bartholins and Skenes: normal                 Vagina: normal appearing vagina with normal color and discharge, no lesions              Cervix: {CHL AMB PHY EX CERVIX NORM DEFAULT:[id number]::"no lesions"}  Bimanual Exam:  Uterus:  {CHL AMB PHY EX UTERUS NORM DEFAULT:[phone]::"normal size, contour, position, consistency, mobility, non-tender"}              Adnexa: {CHL AMB PHY EX ADNEXA NO MASS DEFAULT:[phone]::"no mass, fullness, tenderness"}               Rectovaginal: Confirms               Anus:  normal sphincter tone, no lesions  *** chaperoned for the exam.  A:  Well Woman with normal exam  P:

## 2021-03-16 ENCOUNTER — Ambulatory Visit: Payer: BC Managed Care – PPO | Admitting: Obstetrics and Gynecology

## 2021-03-17 ENCOUNTER — Telehealth: Payer: BC Managed Care – PPO | Admitting: Family Medicine

## 2021-03-17 VITALS — Ht 71.25 in | Wt 260.0 lb

## 2021-03-17 DIAGNOSIS — R059 Cough, unspecified: Secondary | ICD-10-CM | POA: Diagnosis not present

## 2021-03-17 MED ORDER — BENZONATATE 200 MG PO CAPS: 200.0000 mg | ORAL_CAPSULE | Freq: Two times a day (BID) | ORAL | 0 refills | Status: DC | PRN

## 2021-03-17 MED ORDER — AZITHROMYCIN 250 MG PO TABS: ORAL_TABLET | ORAL | 0 refills | Status: DC

## 2021-03-17 MED ORDER — GUAIFENESIN-CODEINE 100-10 MG/5ML PO SOLN: 5.0000 mL | Freq: Three times a day (TID) | ORAL | 0 refills | Status: DC | PRN

## 2021-03-17 NOTE — Progress Notes (Signed)
   Sierra [name] is a 40 y.o. female who presents today for a virtual office visit.  Assessment/Plan:  Cough Discussed limitations of virtual visit and inability to perform physical exam. It is reassuring that she has had improvement for the last couple of days.  No red flag signs or symptoms.  We will treat symptomatically with Tessalon and guaifenesin-codeine cough syrup.  Will send in pocket prescription for azithromycin with instruction to not start unless symptoms do not improve with over the next several days.  Encourage good oral hydration.  Discussed reasons to return to care.     Subjective:  HPI:  Patient here with cough. Went to a concert about 2 months ago. Has cough for several weeks afterwards. Eventually got better. The last few days symptoms return. Had severe cough, weakness, and chills. Took home covid test negative. Has had a close contact that has been was diagnosed with flu. Cough has productive. Fevers and chills went away. Tried taking theraflu and mucinex which helped. SHe had some hoarseness that has improved. No shortness of breath. No chest pain. No sinus congestion. No sore throat.        Objective/Observations  Physical Exam: Gen: NAD, resting comfortably Pulm: Normal work of breathing Neuro: Grossly normal, moves all extremities Psych: Normal affect and thought content  Virtual Visit via Video   I connected with Sierra [name] on 03/17/21 at  8:00 AM EDT by a video enabled telemedicine application and verified that I am speaking with the correct person using two identifiers. The limitations of evaluation and management by telemedicine and the availability of in person appointments were discussed. The patient expressed understanding and agreed to proceed.   Patient location: Home Provider location: [place] [employer] [facility] Persons participating in the virtual visit: Myself and Patient     Katina Degree. [name], MD 03/17/2021 8:22 AM

## 2021-04-03 ENCOUNTER — Ambulatory Visit: Payer: BC Managed Care – PPO | Admitting: Obstetrics and Gynecology

## 2021-04-03 ENCOUNTER — Ambulatory Visit: Payer: BC Managed Care – PPO

## 2021-04-03 ENCOUNTER — Encounter: Payer: Self-pay | Admitting: Obstetrics and Gynecology

## 2021-04-03 ENCOUNTER — Other Ambulatory Visit: Payer: Self-pay

## 2021-04-03 VITALS — BP 120/70 | HR 77 | Ht 71.5 in | Wt 271.0 lb

## 2021-04-03 DIAGNOSIS — A5901 Trichomonal vulvovaginitis: Secondary | ICD-10-CM

## 2021-04-03 DIAGNOSIS — Z113 Encounter for screening for infections with a predominantly sexual mode of transmission: Secondary | ICD-10-CM | POA: Diagnosis not present

## 2021-04-03 DIAGNOSIS — N761 Subacute and chronic vaginitis: Secondary | ICD-10-CM | POA: Diagnosis not present

## 2021-04-03 LAB — WET PREP FOR TRICH, YEAST, CLUE

## 2021-04-03 MED ORDER — METRONIDAZOLE 500 MG PO TABS: 500.0000 mg | ORAL_TABLET | Freq: Two times a day (BID) | ORAL | 0 refills | Status: DC

## 2021-04-03 NOTE — Patient Instructions (Signed)

## 2021-04-03 NOTE — Progress Notes (Signed)
GYNECOLOGY  VISIT   HPI: 40 y.o.   Single Black or African American Not Hispanic or Latino  female   [phone] with Patient's last menstrual period was 03/05/2021.   here for  vaginal discharge. Patient states that it has been present for about 3 weeks. She denies itching or odor.   The d/c is clear, no irritation. Sexually active, same partner on and off x 10 years. No concerns about STD's.  GYNECOLOGIC HISTORY: Patient's last menstrual period was 03/05/2021. Contraception:OCP Menopausal hormone therapy: none         OB History     Gravida  5   Para  0   Term  0   Preterm  0   AB  5   Living  0      SAB  1   IAB  4   Ectopic  0   Multiple  0   Live Births  0              Patient Active Problem List   Diagnosis Date Noted   Stress 03/11/2019   Prediabetes 03/09/2019   Vitamin D deficiency 03/09/2019    Past Medical History:  Diagnosis Date   Family history of diabetes mellitus (DM)    History of vitamin D deficiency    Prediabetes 03/09/2019   Prediabetes    Vitamin D deficiency 03/09/2019    No past surgical history on file.  Current Outpatient Medications  Medication Sig Dispense Refill   norethindrone-ethinyl estradiol (JUNEL 1/20) 1-20 MG-MCG tablet TAKE 1 TABLET BY MOUTH EVERY DAY 84 tablet 0   Vitamin D, Ergocalciferol, (DRISDOL) 1.25 MG (50000 UNIT) CAPS capsule Take 50,000 Units by mouth once a week.     No current facility-administered medications for this visit.     ALLERGIES: Patient has no known allergies.  Family History  Problem Relation Age of Onset   Diabetes Father        type 2   Cancer Maternal Grandmother 26       related to smoking, unsure type ? colon    Social History   Socioeconomic History   Marital status: Single    Spouse name: Not on file   Number of children: 0   Years of education: Not on file   Highest education level: Not on file  Occupational History    Employer: [employer]  Tobacco Use    Smoking status: Never   Smokeless tobacco: Never  Vaping Use   Vaping Use: Never used  Substance and Sexual Activity   Alcohol use: Yes    Alcohol/week: 3.0 standard drinks    Types: 3 Glasses of wine per week    Comment: wine and occ mixed drink   Drug use: No   Sexual activity: Yes    Partners: Male    Birth control/protection: Pill  Other Topics Concern   Not on file  Social History Narrative   ** Merged History Encounter **       Social Determinants of Health   Financial Resource Strain: Not on file  Food Insecurity: Not on file  Transportation Needs: Not on file  Physical Activity: Not on file  Stress: Not on file  Social Connections: Not on file  Intimate Partner Violence: Not on file    Review of Systems  All other systems reviewed and are negative.  PHYSICAL EXAMINATION:    BP 120/70   Pulse 77   Ht 5' 11.5" (1.816 m)   Wt  271 lb (122.9 kg)   LMP 03/05/2021   SpO2 99%   BMI 37.27 kg/m     General appearance: alert, cooperative and appears stated age   Pelvic: External genitalia:  no lesions              Urethra:  normal appearing urethra with no masses, tenderness or lesions              Bartholins and Skenes: normal                 Vagina: slightly erythematous appearing vagina with a slight amount of white, thin vaginal discharge              Cervix: no lesions, there are red dots on her cervix               Chaperone was present for exam.  1. Subacute vaginitis - WET PREP FOR TRICH, YEAST, CLUE  2. Trichomonas vaginitis Discussed Trich - metroNIDAZOLE (FLAGYL) 500 MG tablet; Take 1 tablet (500 mg total) by mouth 2 (two) times daily.  Dispense: 14 tablet; Refill: 0 -Her partner needs to be seen and treated -She needs repeat testing in 1-3 months (will do at her annual exam in 2/23)  3. Screening examination for STD (sexually transmitted disease) - RPR - HIV Antibody (routine testing w rflx) - Hepatitis C antibody - SURESWAB CT/NG/T.  vaginalis

## 2021-04-04 LAB — HEPATITIS C ANTIBODY
Hepatitis C Ab: NONREACTIVE
SIGNAL TO CUT-OFF: 0.09 (ref <1.00)

## 2021-04-04 LAB — HIV ANTIBODY (ROUTINE TESTING W REFLEX): HIV 1&2 Ab, 4th Generation: NONREACTIVE

## 2021-04-04 LAB — RPR: RPR Ser Ql: NONREACTIVE

## 2021-04-10 LAB — SURESWAB CT/NG/T. VAGINALIS
C. trachomatis RNA, TMA: NOT DETECTED
N. gonorrhoeae RNA, TMA: NOT DETECTED
Trichomonas vaginalis RNA: DETECTED — AB

## 2021-04-12 ENCOUNTER — Ambulatory Visit
Admission: RE | Admit: 2021-04-12 | Discharge: 2021-04-12 | Disposition: A | Discharge status: Home / Self Care | Payer: BC Managed Care – PPO | Source: Ambulatory Visit | Attending: Obstetrics and Gynecology | Admitting: Obstetrics and Gynecology

## 2021-04-12 ENCOUNTER — Other Ambulatory Visit: Payer: Self-pay

## 2021-04-12 DIAGNOSIS — Z1231 Encounter for screening mammogram for malignant neoplasm of breast: Secondary | ICD-10-CM | POA: Diagnosis not present

## 2021-05-30 ENCOUNTER — Other Ambulatory Visit: Payer: Self-pay | Admitting: Obstetrics and Gynecology

## 2021-05-30 NOTE — Telephone Encounter (Signed)
Last annual exam was 10/21 Annual scheduled on 06/19/21 Last mammogram 03/2021

## 2021-06-12 NOTE — Progress Notes (Signed)
41 y.o. [id number] Single Black or African American Not Hispanic or Latino female here for annual exam.  No current partner, she has been active, uses condoms. No dyspareunia.  Period Cycle (Days): 28 Period Duration (Days): 5-6 Period Pattern: Regular Menstrual Flow: Light Menstrual Control: Panty liner, Tampon Menstrual Control Change Freq (Hours): 8 Dysmenorrhea: None  H/oTrich, treated in 11/22.   Patient's last menstrual period was 06/14/2021.          Sexually active: Yes.    The current method of family planning is OCP (estrogen/progesterone).    Exercising: No.  The patient does not participate in regular exercise at present. Smoker:  no  Health Maintenance: Pap:  01/28/2018 WNL NEG HPV, 2016 negative, negative hpv  History of abnormal Pap:  no MMG:  111/30/22  density C Bi-rads 1 neg  BMD:   none  Colonoscopy: none  TDaP:  01/02/17  Gardasil: none, reviewed will consider    reports that she has never smoked. She has never used smokeless tobacco. She reports current alcohol use of about 3.0 standard drinks per week. She reports that she does not use drugs. She works in [employer] in [employer].   Past Medical History:  Diagnosis Date   Family history of diabetes mellitus (DM)    History of vitamin D deficiency    Prediabetes 03/09/2019   Prediabetes    Vitamin D deficiency 03/09/2019    No past surgical history on file.  Current Outpatient Medications  Medication Sig Dispense Refill   JUNEL 1/20 1-20 MG-MCG tablet TAKE 1 TABLET BY MOUTH EVERY DAY 84 tablet 0   Vitamin D, Ergocalciferol, (DRISDOL) 1.25 MG (50000 UNIT) CAPS capsule Take 50,000 Units by mouth once a week.     No current facility-administered medications for this visit.    Family History  Problem Relation Age of Onset   Diabetes Father        type 2   Cancer Maternal Grandmother 7       related to smoking, unsure type ? colon    Review of Systems  All other systems reviewed and are  negative.  Exam:   BP 124/76    Pulse 72    Ht 6' (1.829 m)    Wt 277 lb (125.6 kg)    LMP 06/14/2021    SpO2 100%    BMI 37.57 kg/m   Weight change: @WEIGHTCHANGE @ Height:   Height: 6' (182.9 cm)  Ht Readings from Last 3 Encounters:  06/22/21 6' (1.829 m)  04/03/21 5' 11.5" (1.816 m)  03/17/21 5' 11.25" (1.81 m)    General appearance: alert, cooperative and appears stated age Head: Normocephalic, without obvious abnormality, atraumatic Neck: no adenopathy, supple, symmetrical, trachea midline and thyroid normal to inspection and palpation Lungs: clear to auscultation bilaterally Cardiovascular: regular rate and rhythm Breasts: normal appearance, no masses or tenderness Abdomen: soft, non-tender; non distended,  no masses,  no organomegaly Extremities: extremities normal, atraumatic, no cyanosis or edema Skin: Skin color, texture, turgor normal. No rashes or lesions Lymph nodes: Cervical, supraclavicular, and axillary nodes normal. No abnormal inguinal nodes palpated Neurologic: Grossly normal   Pelvic: External genitalia:  no lesions              Urethra:  normal appearing urethra with no masses, tenderness or lesions              Bartholins and Skenes: normal  Vagina: normal appearing vagina with a small amount of thin, white vaginal d/c (no symptoms)              Cervix: no lesions               Bimanual Exam:  Uterus:   no masses or tenderness              Adnexa: no mass, fullness, tenderness               Rectovaginal: Confirms               Anus:  normal sphincter tone, no lesions  Carolynn Serve chaperoned for the exam.  1. Well woman exam Discussed breast self exam Discussed calcium and vit D intake Mammogram UTD Discussed gardasil  2. History of trichomonal vaginitis Treated, no longer with that partner - Trichomonas vaginalis, RNA  3. Screening examination for STD (sexually transmitted disease) Declines other than screening for trich  4.  Pre-diabetes - Hemoglobin A1c  5. Vitamin D deficiency She isn't currently on supplementation - VITAMIN D 25 Hydroxy (Vit-D Deficiency, Fractures)  6. Laboratory exam ordered as part of routine general medical examination - CBC - Comprehensive metabolic panel - Lipid panel  7. BMI 37.0-37.9, adult - Lipid panel - TSH  8. Weight gain - Lipid panel - TSH  9. Encounter for surveillance of contraceptive pills - norethindrone-ethinyl estradiol (JUNEL 1/20) 1-20 MG-MCG tablet; Take 1 tablet by mouth daily.  Dispense: 84 tablet; Refill: 3

## 2021-06-22 ENCOUNTER — Other Ambulatory Visit: Payer: Self-pay

## 2021-06-22 ENCOUNTER — Encounter: Payer: Self-pay | Admitting: Obstetrics and Gynecology

## 2021-06-22 ENCOUNTER — Ambulatory Visit (INDEPENDENT_AMBULATORY_CARE_PROVIDER_SITE_OTHER): Payer: BC Managed Care – PPO | Admitting: Obstetrics and Gynecology

## 2021-06-22 VITALS — BP 124/76 | HR 72 | Ht 72.0 in | Wt 277.0 lb

## 2021-06-22 DIAGNOSIS — Z3041 Encounter for surveillance of contraceptive pills: Secondary | ICD-10-CM

## 2021-06-22 DIAGNOSIS — Z8619 Personal history of other infectious and parasitic diseases: Secondary | ICD-10-CM | POA: Diagnosis not present

## 2021-06-22 DIAGNOSIS — Z01419 Encounter for gynecological examination (general) (routine) without abnormal findings: Secondary | ICD-10-CM | POA: Diagnosis not present

## 2021-06-22 DIAGNOSIS — R635 Abnormal weight gain: Secondary | ICD-10-CM

## 2021-06-22 DIAGNOSIS — Z0000 Encounter for general adult medical examination without abnormal findings: Secondary | ICD-10-CM | POA: Diagnosis not present

## 2021-06-22 DIAGNOSIS — Z113 Encounter for screening for infections with a predominantly sexual mode of transmission: Secondary | ICD-10-CM

## 2021-06-22 DIAGNOSIS — R7303 Prediabetes: Secondary | ICD-10-CM | POA: Diagnosis not present

## 2021-06-22 DIAGNOSIS — Z6837 Body mass index (BMI) 37.0-37.9, adult: Secondary | ICD-10-CM | POA: Diagnosis not present

## 2021-06-22 DIAGNOSIS — E559 Vitamin D deficiency, unspecified: Secondary | ICD-10-CM

## 2021-06-22 MED ORDER — NORETHINDRONE ACET-ETHINYL EST 1-20 MG-MCG PO TABS: 1.0000 | ORAL_TABLET | Freq: Every day | ORAL | 3 refills | Status: DC

## 2021-06-22 NOTE — Patient Instructions (Signed)

## 2021-06-23 LAB — LIPID PANEL
Cholesterol: 149 mg/dL (ref <200)
HDL: 69 mg/dL (ref >=50)
LDL Cholesterol (Calc): 63 mg/dL (calc)
Non-HDL Cholesterol (Calc): 80 mg/dL (calc) (ref <130)
Total CHOL/HDL Ratio: 2.2 (calc) (ref <5.0)
Triglycerides: 90 mg/dL (ref <150)

## 2021-06-23 LAB — TSH: TSH: 1.55 mIU/L

## 2021-06-23 LAB — COMPREHENSIVE METABOLIC PANEL
AG Ratio: 1.6 (calc) (ref 1.0–2.5)
ALT: 32 U/L — ABNORMAL HIGH (ref 6–29)
AST: 16 U/L (ref 10–30)
Albumin: 4.5 g/dL (ref 3.6–5.1)
Alkaline phosphatase (APISO): 57 U/L (ref 31–125)
BUN: 16 mg/dL (ref 7–25)
CO2: 26 mmol/L (ref 20–32)
Calcium: 9.4 mg/dL (ref 8.6–10.2)
Chloride: 103 mmol/L (ref 98–110)
Creat: 0.75 mg/dL (ref 0.50–0.99)
Globulin: 2.8 g/dL (calc) (ref 1.9–3.7)
Glucose, Bld: 100 mg/dL — ABNORMAL HIGH (ref 65–99)
Potassium: 4.5 mmol/L (ref 3.5–5.3)
Sodium: 138 mmol/L (ref 135–146)
Total Bilirubin: 0.6 mg/dL (ref 0.2–1.2)
Total Protein: 7.3 g/dL (ref 6.1–8.1)

## 2021-06-23 LAB — HEMOGLOBIN A1C
Hgb A1c MFr Bld: 5.5 % of total Hgb (ref <5.7)
Mean Plasma Glucose: 111 mg/dL
eAG (mmol/L): 6.2 mmol/L

## 2021-06-23 LAB — CBC
HCT: 42 % (ref 35.0–45.0)
Hemoglobin: 12.9 g/dL (ref 11.7–15.5)
MCH: 23.9 pg — ABNORMAL LOW (ref 27.0–33.0)
MCHC: 30.7 g/dL — ABNORMAL LOW (ref 32.0–36.0)
MCV: 77.8 fL — ABNORMAL LOW (ref 80.0–100.0)
MPV: 11.5 fL (ref 7.5–12.5)
Platelets: 272 10*3/uL (ref 140–400)
RBC: 5.4 10*6/uL — ABNORMAL HIGH (ref 3.80–5.10)
RDW: 13.9 % (ref 11.0–15.0)
WBC: 6.4 10*3/uL (ref 3.8–10.8)

## 2021-06-23 LAB — VITAMIN D 25 HYDROXY (VIT D DEFICIENCY, FRACTURES): Vit D, 25-Hydroxy: 29 ng/mL — ABNORMAL LOW (ref 30–100)

## 2021-06-23 LAB — TRICHOMONAS VAGINALIS, PROBE AMP: Trichomonas vaginalis RNA: NOT DETECTED

## 2021-07-08 DIAGNOSIS — E559 Vitamin D deficiency, unspecified: Secondary | ICD-10-CM | POA: Diagnosis not present

## 2021-07-08 DIAGNOSIS — R5383 Other fatigue: Secondary | ICD-10-CM | POA: Diagnosis not present

## 2021-07-08 DIAGNOSIS — R739 Hyperglycemia, unspecified: Secondary | ICD-10-CM | POA: Diagnosis not present

## 2021-07-08 DIAGNOSIS — Z6837 Body mass index (BMI) 37.0-37.9, adult: Secondary | ICD-10-CM | POA: Diagnosis not present

## 2021-07-08 DIAGNOSIS — E669 Obesity, unspecified: Secondary | ICD-10-CM | POA: Diagnosis not present

## 2021-07-08 DIAGNOSIS — Z79899 Other long term (current) drug therapy: Secondary | ICD-10-CM | POA: Diagnosis not present

## 2021-07-08 DIAGNOSIS — N911 Secondary amenorrhea: Secondary | ICD-10-CM | POA: Diagnosis not present

## 2021-07-08 DIAGNOSIS — Z0000 Encounter for general adult medical examination without abnormal findings: Secondary | ICD-10-CM | POA: Diagnosis not present

## 2021-10-25 ENCOUNTER — Ambulatory Visit (INDEPENDENT_AMBULATORY_CARE_PROVIDER_SITE_OTHER): Payer: BC Managed Care – PPO

## 2021-10-25 ENCOUNTER — Ambulatory Visit: Payer: BC Managed Care – PPO | Admitting: Podiatry

## 2021-10-25 DIAGNOSIS — M7661 Achilles tendinitis, right leg: Secondary | ICD-10-CM | POA: Diagnosis not present

## 2021-10-25 DIAGNOSIS — M722 Plantar fascial fibromatosis: Secondary | ICD-10-CM

## 2021-10-25 MED ORDER — MELOXICAM 15 MG PO TABS
15.0000 mg | ORAL_TABLET | Freq: Every day | ORAL | 0 refills | Status: DC
Start: 2021-10-25 — End: 2021-11-13

## 2021-10-26 ENCOUNTER — Encounter: Payer: Self-pay | Admitting: Podiatry

## 2021-10-26 ENCOUNTER — Encounter: Payer: Self-pay | Admitting: Physician Assistant

## 2021-10-26 ENCOUNTER — Ambulatory Visit: Payer: BC Managed Care – PPO | Admitting: Physician Assistant

## 2021-10-26 ENCOUNTER — Ambulatory Visit (INDEPENDENT_AMBULATORY_CARE_PROVIDER_SITE_OTHER)
Admission: RE | Admit: 2021-10-26 | Discharge: 2021-10-26 | Disposition: A | Discharge status: Home / Self Care | Payer: BC Managed Care – PPO | Source: Ambulatory Visit | Attending: Physician Assistant | Admitting: Physician Assistant

## 2021-10-26 VITALS — BP 131/86 | HR 95 | Temp 97.9°F | Ht 72.0 in | Wt 282.8 lb

## 2021-10-26 DIAGNOSIS — F439 Reaction to severe stress, unspecified: Secondary | ICD-10-CM | POA: Diagnosis not present

## 2021-10-26 DIAGNOSIS — R0789 Other chest pain: Secondary | ICD-10-CM | POA: Diagnosis not present

## 2021-10-26 DIAGNOSIS — R079 Chest pain, unspecified: Secondary | ICD-10-CM | POA: Diagnosis not present

## 2021-10-26 NOTE — Patient Instructions (Signed)
Very good to meet you today, baby girl is precious! Enjoy her! Congrats to your family.  Let's get a chest xray at [facility]. Treat pending results.  Most likely some added stress going on. See handout for ways to help de-stress.

## 2021-10-26 NOTE — Progress Notes (Signed)
Subjective:    Patient ID: Sierra [name], female    DOB: June 28, 1980, 41 y.o.   MRN: [id number]  Chief Complaint  Patient presents with   Chest Pain    Pt c/o slight tightness in chest past 3 weeks in upper right area of chest; only when breathing out but over past week hasn't had any issues; pt says no chest pain but pressure     Chest Pain    Patient is in today for chest pain. Started 3 weeks ago; last felt the pain about 1.5 weeks ago. When she went to take a breath and let it out, felt it in right upper chest "Just didn't feel like normal breathing." Tight, awkward feeling when pushing breath out It would go away on its own, not every day No hx of any lung issues  No smoking or vaping  12 steps in her house, did not feel any SOB or CP with exertion  No recent illnesses or travel New baby niece, Francena Hanly, almost 2 months old, says she has been worrying some to make sure she stays healthy and good; also has flare-up of R achilles tendonitis and has been wearing walking boot No other assoc symptoms   Past Medical History:  Diagnosis Date   Family history of diabetes mellitus (DM)    History of vitamin D deficiency    Prediabetes 03/09/2019   Prediabetes    Vitamin D deficiency 03/09/2019    History reviewed. No pertinent surgical history.  Family History  Problem Relation Age of Onset   Diabetes Father        type 2   Cancer Maternal Grandmother 60       related to smoking, unsure type ? colon    Social History   Tobacco Use   Smoking status: Never   Smokeless tobacco: Never  Vaping Use   Vaping Use: Never used  Substance Use Topics   Alcohol use: Yes    Alcohol/week: 3.0 standard drinks of alcohol    Types: 3 Glasses of wine per week    Comment: wine and occ mixed drink   Drug use: No     No Known Allergies  Review of Systems  Cardiovascular:  Positive for chest pain.   NEGATIVE UNLESS OTHERWISE INDICATED IN HPI      Objective:     BP 131/86  (BP Location: Left Arm)   Pulse 95   Temp 97.9 F (36.6 C) (Temporal)   Ht 6' (1.829 m)   Wt 282 lb 12.8 oz (128.3 kg)   LMP 10/25/2021 (Exact Date)   SpO2 97%   BMI 38.35 kg/m   Wt Readings from Last 3 Encounters:  10/26/21 282 lb 12.8 oz (128.3 kg)  06/22/21 277 lb (125.6 kg)  04/03/21 271 lb (122.9 kg)    BP Readings from Last 3 Encounters:  10/26/21 131/86  06/22/21 124/76  04/03/21 120/70     Physical Exam Constitutional:      Appearance: She is well-developed. She is obese.  Cardiovascular:     Rate and Rhythm: Normal rate and regular rhythm.     Heart sounds: Normal heart sounds. No murmur heard. Pulmonary:     Effort: Pulmonary effort is normal.     Breath sounds: Normal breath sounds.  Chest:     Chest wall: No mass or tenderness.  Neurological:     Mental Status: She is alert.  Psychiatric:        Mood and Affect: Mood normal.  Assessment & Plan:   Problem List Items Addressed This Visit   None Visit Diagnoses     Intermittent right-sided chest pain    -  Primary   Relevant Orders   DG Chest 2 View   Stress            1. Intermittent right-sided chest pain 2. Stress  Hasn't had this concern in the last week She would feel reassured with a CXR, - placed order today Reassured her it does not sound cardiac in nature, glad things are better now I think this is most likely related to stress Red flags / ER precautions discussed  Recheck prn    This note was prepared with assistance of Dragon voice recognition software. Occasional wrong-word or sound-a-like substitutions may have occurred due to the inherent limitations of voice recognition software.  Amika Tassin M Jaidence Geisler, PA-C

## 2021-10-26 NOTE — Progress Notes (Signed)
  Subjective:  Patient ID: Sierra [name], female    DOB: 1980-05-20,  MRN: [id number]  No chief complaint on file.   41 y.o. female presents with the above complaint.  Patient presents with complaint of right Achilles tendon pain.  Patient states is very exertional.  Hurts with ambulation has progressive gotten worse.  She wanted get it evaluated tomorrow for 3 weeks in the back of the heel.  Hurts with taking for step in the morning.  She is a prediabetic family history of diabetes.  She wanted to get it evaluated she denies seeing anyone else prior to seeing me for this.  She denies any other acute complaints.   Review of Systems: Negative except as noted in the HPI. Denies N/V/F/Ch.  Past Medical History:  Diagnosis Date   Family history of diabetes mellitus (DM)    History of vitamin D deficiency    Prediabetes 03/09/2019   Prediabetes    Vitamin D deficiency 03/09/2019    Current Outpatient Medications:    meloxicam (MOBIC) 15 MG tablet, Take 1 tablet (15 mg total) by mouth daily. (Patient not taking: Reported on 10/26/2021), Disp: 30 tablet, Rfl: 0   norethindrone-ethinyl estradiol (JUNEL 1/20) 1-20 MG-MCG tablet, Take 1 tablet by mouth daily., Disp: 84 tablet, Rfl: 3   Vitamin D, Ergocalciferol, (DRISDOL) 1.25 MG (50000 UNIT) CAPS capsule, Take 50,000 Units by mouth once a week., Disp: , Rfl:   Social History   Tobacco Use  Smoking Status Never  Smokeless Tobacco Never    No Known Allergies Objective:  There were no vitals filed for this visit. There is no height or weight on file to calculate BMI. Constitutional Well developed. Well nourished.  Vascular Dorsalis pedis pulses palpable bilaterally. Posterior tibial pulses palpable bilaterally. Capillary refill normal to all digits.  No cyanosis or clubbing noted. Pedal hair growth normal.  Neurologic Normal speech. Oriented to person, place, and time. Epicritic sensation to light touch grossly present bilaterally.   Dermatologic Nails well groomed and normal in appearance. No open wounds. No skin lesions.  Orthopedic: Pain on palpation of right Achilles tendon insertion.  Positive Haglund's deformity noted.  Positive Silfverskiold test with gastrocnemius equinus noted.  Hurts at the insertion of the Achilles.  No plantar fascial pain no pain at the posterior tibial tendon peroneal tendon.   Radiographs: 3 views of skeletally mature right foot: Posterior spurring noted with Haglund's deformity.  Flatfoot deformity noted as well.  Midfoot arthritis noted.  No other bony abnormalities identified. Assessment:   1. Achilles tendinitis, right leg    Plan:  Patient was evaluated and treated and all questions answered.  Right Achilles tendinitis with Haglund's deformity and positive Silfverskiold with gastrocnemius equinus -All questions and concerns were discussed with the patient in extensive detail -Given the amount of pain that she is experiencing she will benefit from cam boot immobilization allow the soft tissue structure to heal appropriately.  If there is still any residual pain we will discuss steroid injection in that area.  I discussed with the patient she states understand like to proceed with that. -Cam boot was dispensed  No follow-ups on file.

## 2021-11-09 ENCOUNTER — Ambulatory Visit: Payer: BC Managed Care – PPO | Admitting: Family Medicine

## 2021-11-12 ENCOUNTER — Encounter: Payer: Self-pay | Admitting: Obstetrics and Gynecology

## 2021-11-13 ENCOUNTER — Ambulatory Visit (INDEPENDENT_AMBULATORY_CARE_PROVIDER_SITE_OTHER): Payer: BC Managed Care – PPO | Admitting: Family Medicine

## 2021-11-13 ENCOUNTER — Other Ambulatory Visit: Payer: Self-pay

## 2021-11-13 ENCOUNTER — Telehealth: Payer: Self-pay | Admitting: *Deleted

## 2021-11-13 ENCOUNTER — Encounter: Payer: Self-pay | Admitting: Family Medicine

## 2021-11-13 VITALS — BP 138/88 | HR 90 | Temp 98.1°F | Ht 72.0 in | Wt 287.4 lb

## 2021-11-13 DIAGNOSIS — E559 Vitamin D deficiency, unspecified: Secondary | ICD-10-CM | POA: Diagnosis not present

## 2021-11-13 DIAGNOSIS — Z13220 Encounter for screening for lipoid disorders: Secondary | ICD-10-CM | POA: Diagnosis not present

## 2021-11-13 DIAGNOSIS — R7303 Prediabetes: Secondary | ICD-10-CM

## 2021-11-13 DIAGNOSIS — Z0001 Encounter for general adult medical examination with abnormal findings: Secondary | ICD-10-CM

## 2021-11-13 LAB — CBC
HCT: 38.9 % (ref 36.0–46.0)
Hemoglobin: 12.2 g/dL (ref 12.0–15.0)
MCHC: 31.4 g/dL (ref 30.0–36.0)
MCV: 75.9 fl — ABNORMAL LOW (ref 78.0–100.0)
Platelets: 227 10*3/uL (ref 150.0–400.0)
RBC: 5.13 Mil/uL — ABNORMAL HIGH (ref 3.87–5.11)
RDW: 14.7 % (ref 11.5–15.5)
WBC: 7.5 10*3/uL (ref 4.0–10.5)

## 2021-11-13 LAB — HEMOGLOBIN A1C: Hgb A1c MFr Bld: 5.9 % (ref 4.6–6.5)

## 2021-11-13 LAB — COMPREHENSIVE METABOLIC PANEL
ALT: 28 U/L (ref 0–35)
AST: 19 U/L (ref 0–37)
Albumin: 4.3 g/dL (ref 3.5–5.2)
Alkaline Phosphatase: 50 U/L (ref 39–117)
BUN: 14 mg/dL (ref 6–23)
CO2: 23 mEq/L (ref 19–32)
Calcium: 9.3 mg/dL (ref 8.4–10.5)
Chloride: 101 mEq/L (ref 96–112)
Creatinine, Ser: 0.67 mg/dL (ref 0.40–1.20)
GFR: 107.13 mL/min (ref >60.00)
Glucose, Bld: 91 mg/dL (ref 70–99)
Potassium: 4.1 mEq/L (ref 3.5–5.1)
Sodium: 141 mEq/L (ref 135–145)
Total Bilirubin: 0.3 mg/dL (ref 0.2–1.2)
Total Protein: 7 g/dL (ref 6.0–8.3)

## 2021-11-13 LAB — LIPID PANEL
Cholesterol: 110 mg/dL (ref 0–200)
HDL: 60.2 mg/dL (ref >39.00)
LDL Cholesterol: 37 mg/dL (ref 0–99)
NonHDL: 49.52
Total CHOL/HDL Ratio: 2
Triglycerides: 63 mg/dL (ref 0.0–149.0)
VLDL: 12.6 mg/dL (ref 0.0–40.0)

## 2021-11-13 LAB — VITAMIN D 25 HYDROXY (VIT D DEFICIENCY, FRACTURES): VITD: 28.57 ng/mL — ABNORMAL LOW (ref 30.00–100.00)

## 2021-11-13 LAB — TSH: TSH: 2.01 u[IU]/mL (ref 0.35–5.50)

## 2021-11-13 MED ORDER — WEGOVY 0.25 MG/0.5ML ~~LOC~~ SOAJ: 0.2500 mg | SUBCUTANEOUS | 0 refills | Status: DC

## 2021-11-13 NOTE — Progress Notes (Signed)
Chief Complaint:  Sierra [name] is a 41 y.o. female who presents today for her annual comprehensive physical exam.    Assessment/Plan:  Chronic Problems Addressed Today: Morbid obesity (HCC) Discussed lifestyle modifications.  BMI is 38 today with comorbidities.  We will try starting Wegovy to help with weight loss.  We discussed potential side effects.  Recheck labs today.  She will follow-up with me in a few weeks via MyChart.  Vitamin D deficiency On vitamin D supplementation.  We will check vitamin D level today.  Prediabetes Check A1c.  We will be starting GLP-1 agonist as above.  If insurance would not pay for [facility] would consider trying Mounjaro versus Ozempic.   Preventative Healthcare: Check labs today.  Follows with GYN for Paps.  Up-to-date on vaccines.  Patient Counseling(The following topics were reviewed and/or handout was given):  -Nutrition: Stressed importance of moderation in sodium/caffeine intake, saturated fat and cholesterol, caloric balance, sufficient intake of fresh fruits, vegetables, and fiber.  -Stressed the importance of regular exercise.   -Substance Abuse: Discussed cessation/primary prevention of tobacco, alcohol, or other drug use; driving or other dangerous activities under the influence; availability of treatment for abuse.   -Injury prevention: Discussed safety belts, safety helmets, smoke detector, smoking near bedding or upholstery.   -Sexuality: Discussed sexually transmitted diseases, partner selection, use of condoms, avoidance of unintended pregnancy and contraceptive alternatives.   -Dental health: Discussed importance of regular tooth brushing, flossing, and dental visits.  -Health maintenance and immunizations reviewed. Please refer to Health maintenance section.  Return to care in 1 year for next preventative visit.     Subjective:  HPI:  She has no acute complaints today.   Lifestyle Diet: None specific.  Exercise: Limited to  due achilles tendonitis.      11/13/2021    8:30 AM  Depression screen PHQ 2/9  Decreased Interest 0  Down, Depressed, Hopeless 0  PHQ - 2 Score 0   There are no preventive care reminders to display for this patient.   ROS: Per HPI, otherwise a complete review of systems was negative.   PMH:  The following were reviewed and entered/updated in epic: Past Medical History:  Diagnosis Date   Family history of diabetes mellitus (DM)    History of vitamin D deficiency    Prediabetes 03/09/2019   Prediabetes    Vitamin D deficiency 03/09/2019   Patient Active Problem List   Diagnosis Date Noted   Morbid obesity (HCC) 11/13/2021   Prediabetes 03/09/2019   Vitamin D deficiency 03/09/2019   History reviewed. No pertinent surgical history.  Family History  Problem Relation Age of Onset   Diabetes Father        type 2   Cancer Maternal Grandmother 65       related to smoking, unsure type ? colon    Medications- reviewed and updated Current Outpatient Medications  Medication Sig Dispense Refill   norethindrone-ethinyl estradiol (JUNEL 1/20) 1-20 MG-MCG tablet Take 1 tablet by mouth daily. 84 tablet 3   Semaglutide-Weight Management (WEGOVY) 0.25 MG/0.5ML SOAJ Inject 0.25 mg into the skin once a week. 2 mL 0   Vitamin D, Ergocalciferol, (DRISDOL) 1.25 MG (50000 UNIT) CAPS capsule Take 50,000 Units by mouth once a week.     No current facility-administered medications for this visit.    Allergies-reviewed and updated No Known Allergies  Social History   Socioeconomic History   Marital status: Single    Spouse name: Not on file  Number of children: 0   Years of education: Not on file   Highest education level: Not on file  Occupational History    Employer: [employer]  Tobacco Use   Smoking status: Never   Smokeless tobacco: Never  Vaping Use   Vaping Use: Never used  Substance and Sexual Activity   Alcohol use: Yes    Alcohol/week: 3.0 standard drinks of  alcohol    Types: 3 Glasses of wine per week    Comment: wine and occ mixed drink   Drug use: No   Sexual activity: Yes    Partners: Male    Birth control/protection: Pill  Other Topics Concern   Not on file  Social History Narrative   ** Merged History Encounter **       Social Determinants of Health   Financial Resource Strain: Not on file  Food Insecurity: Not on file  Transportation Needs: Not on file  Physical Activity: Not on file  Stress: Not on file  Social Connections: Not on file        Objective:  Physical Exam: BP 138/88   Pulse 90   Temp 98.1 F (36.7 C) (Temporal)   Ht 6' (1.829 m)   Wt 287 lb 6.4 oz (130.4 kg)   LMP 10/25/2021 (Exact Date)   SpO2 98%   BMI 38.98 kg/m   Body mass index is 38.98 kg/m. Wt Readings from Last 3 Encounters:  11/13/21 287 lb 6.4 oz (130.4 kg)  10/26/21 282 lb 12.8 oz (128.3 kg)  06/22/21 277 lb (125.6 kg)   Gen: NAD, resting comfortably HEENT: TMs normal bilaterally. OP clear. No thyromegaly noted.  CV: RRR with no murmurs appreciated Pulm: NWOB, CTAB with no crackles, wheezes, or rhonchi GI: Normal bowel sounds present. Soft, Nontender, Nondistended. MSK: no edema, cyanosis, or clubbing noted Skin: warm, dry Neuro: CN2-12 grossly intact. Strength 5/5 in upper and lower extremities. Reflexes symmetric and intact bilaterally.  Psych: Normal affect and thought content     Raaga Maeder M. [name], MD 11/13/2021 8:52 AM

## 2021-11-13 NOTE — Telephone Encounter (Signed)
(  [facility]) Rx #: [id number] 0.25MG /0.5ML auto-injectors Waiting for determination

## 2021-11-13 NOTE — Assessment & Plan Note (Signed)
On vitamin D supplementation.  We will check vitamin D level today. 

## 2021-11-13 NOTE — Assessment & Plan Note (Signed)
Discussed lifestyle modifications.  BMI is 38 today with comorbidities.  We will try starting Wegovy to help with weight loss.  We discussed potential side effects.  Recheck labs today.  She will follow-up with me in a few weeks via MyChart.

## 2021-11-13 NOTE — Patient Instructions (Signed)
It was very nice to see you today!  We will check blood work today.  Please continue to work on diet and exercise.  We will try starting [facility].  Please take 0.25 mg weekly for 3 to 4 weeks and then send a message to let me know how you are doing before we increase the dose.  We will see back in year for your next physical.  Come back sooner if needed.  Take care, Dr Jerline Pain  PLEASE NOTE:  If you had any lab tests please let us know if you have not heard back within a few days. You may see your results on mychart before we have a chance to review them but we will give you a call once they are reviewed by [place]. If we ordered any referrals today, please let us know if you have not heard from their office within the next week.   Please try these tips to maintain a healthy lifestyle:  Eat at least 3 REAL meals and 1-2 snacks per day.  Aim for no more than 5 hours between eating.  If you eat breakfast, please do so within one hour of getting up.   Each meal should contain half fruits/vegetables, one quarter protein, and one quarter carbs (no bigger than a computer mouse)  Cut down on sweet beverages. This includes juice, soda, and sweet tea.   Drink at least 1 glass of water with each meal and aim for at least 8 glasses per day  Exercise at least 150 minutes every week.    Preventive Care 60-37 Years Old, Female Preventive care refers to lifestyle choices and visits with your health care provider that can promote health and wellness. Preventive care visits are also called wellness exams. What can I expect for my preventive care visit? Counseling Your health care provider may ask you questions about your: Medical history, including: Past medical problems. Family medical history. Pregnancy history. Current health, including: Menstrual cycle. Method of birth control. Emotional well-being. Home life and relationship well-being. Sexual activity and sexual health. Lifestyle,  including: Alcohol, nicotine or tobacco, and drug use. Access to firearms. Diet, exercise, and sleep habits. Work and work Statistician. Sunscreen use. Safety issues such as seatbelt and bike helmet use. Physical exam Your health care provider will check your: Height and weight. These may be used to calculate your BMI (body mass index). BMI is a measurement that tells if you are at a healthy weight. Waist circumference. This measures the distance around your waistline. This measurement also tells if you are at a healthy weight and may help predict your risk of certain diseases, such as type 2 diabetes and high blood pressure. Heart rate and blood pressure. Body temperature. Skin for abnormal spots. What immunizations do I need?  Vaccines are usually given at various ages, according to a schedule. Your health care provider will recommend vaccines for you based on your age, medical history, and lifestyle or other factors, such as travel or where you work. What tests do I need? Screening Your health care provider may recommend screening tests for certain conditions. This may include: Lipid and cholesterol levels. Diabetes screening. This is done by checking your blood sugar (glucose) after you have not eaten for a while (fasting). Pelvic exam and Pap test. Hepatitis B test. Hepatitis C test. HIV (human immunodeficiency virus) test. STI (sexually transmitted infection) testing, if you are at risk. Lung cancer screening. Colorectal cancer screening. Mammogram. Talk with your health care provider about when  you should start having regular mammograms. This may depend on whether you have a family history of breast cancer. BRCA-related cancer screening. This may be done if you have a family history of breast, ovarian, tubal, or peritoneal cancers. Bone density scan. This is done to screen for osteoporosis. Talk with your health care provider about your test results, treatment options, and if  necessary, the need for more tests. Follow these instructions at home: Eating and drinking  Eat a diet that includes fresh fruits and vegetables, whole grains, lean protein, and low-fat dairy products. Take vitamin and mineral supplements as recommended by your health care provider. Do not drink alcohol if: Your health care provider tells you not to drink. You are pregnant, may be pregnant, or are planning to become pregnant. If you drink alcohol: Limit how much you have to 0-1 drink a day. Know how much alcohol is in your drink. In the [place] one drink equals one 12 oz bottle of beer (355 mL), one 5 oz glass of wine (148 mL), or one 1 oz glass of hard liquor (44 mL). Lifestyle Brush your teeth every morning and night with fluoride toothpaste. Floss one time each day. Exercise for at least 30 minutes 5 or more days each week. Do not use any products that contain nicotine or tobacco. These products include cigarettes, chewing tobacco, and vaping devices, such as e-cigarettes. If you need help quitting, ask your health care provider. Do not use drugs. If you are sexually active, practice safe sex. Use a condom or other form of protection to prevent STIs. If you do not wish to become pregnant, use a form of birth control. If you plan to become pregnant, see your health care provider for a prepregnancy visit. Take aspirin only as told by your health care provider. Make sure that you understand how much to take and what form to take. Work with your health care provider to find out whether it is safe and beneficial for you to take aspirin daily. Find healthy ways to manage stress, such as: Meditation, yoga, or listening to music. Journaling. Talking to a trusted person. Spending time with friends and family. Minimize exposure to UV radiation to reduce your risk of skin cancer. Safety Always wear your seat belt while driving or riding in a vehicle. Do not drive: If you have been drinking  alcohol. Do not ride with someone who has been drinking. When you are tired or distracted. While texting. If you have been using any mind-altering substances or drugs. Wear a helmet and other protective equipment during sports activities. If you have firearms in your house, make sure you follow all gun safety procedures. Seek help if you have been physically or sexually abused. What's next? Visit your health care provider once a year for an annual wellness visit. Ask your health care provider how often you should have your eyes and teeth checked. Stay up to date on all vaccines. This information is not intended to replace advice given to you by your health care provider. Make sure you discuss any questions you have with your health care provider. Document Revised: 10/26/2020 Document Reviewed: 10/26/2020 Elsevier Patient Education  [place].

## 2021-11-13 NOTE — Assessment & Plan Note (Signed)
Check A1c.  We will be starting GLP-1 agonist as above.  If insurance would not pay for [facility] would consider trying Mounjaro versus Ozempic.

## 2021-11-15 NOTE — Telephone Encounter (Signed)
this request is approved from 11/13/2021 to 06/16/2022.  Patient notified

## 2021-11-15 NOTE — Progress Notes (Signed)
Please inform patient of the following:  Vitamin D is a little low.  Recommend starting 1000 IUs daily.  We can recheck in 3 to 6 months.  Her A1c is a little borderline however this should improve with Wegovy we are starting.  We can recheck this in a year.  She should continue to work on diet and exercise.  Everything else is normal.

## 2021-11-17 ENCOUNTER — Other Ambulatory Visit: Payer: Self-pay | Admitting: *Deleted

## 2021-11-17 DIAGNOSIS — E559 Vitamin D deficiency, unspecified: Secondary | ICD-10-CM

## 2021-11-22 ENCOUNTER — Ambulatory Visit: Payer: BC Managed Care – PPO | Admitting: Podiatry

## 2021-11-22 DIAGNOSIS — Q666 Other congenital valgus deformities of feet: Secondary | ICD-10-CM

## 2021-11-22 DIAGNOSIS — M7661 Achilles tendinitis, right leg: Secondary | ICD-10-CM

## 2021-11-22 NOTE — Progress Notes (Signed)
Subjective:  Patient ID: Sierra [name], female    DOB: 1981-01-07,  MRN: [id number]  Chief Complaint  Patient presents with   Foot Pain    Pt stated that she is doing better not at 100% but is improving only has slight discomfort when she wears a flat shoe. No new concerns.     41 y.o. female presents with the above complaint.  Patient presents for follow-up to right Achilles tendinitis.  She states she is doing a lot better with cam boot immobilization.  She is about 75% better.  She would like to discuss next treatment plan.  She also has done better shoes she does not wear as much.'s.   Review of Systems: Negative except as noted in the HPI. Denies N/V/F/Ch.  Past Medical History:  Diagnosis Date   Family history of diabetes mellitus (DM)    History of vitamin D deficiency    Prediabetes 03/09/2019   Prediabetes    Vitamin D deficiency 03/09/2019    Current Outpatient Medications:    norethindrone-ethinyl estradiol (JUNEL 1/20) 1-20 MG-MCG tablet, Take 1 tablet by mouth daily., Disp: 84 tablet, Rfl: 3   Semaglutide-Weight Management (WEGOVY) 0.25 MG/0.5ML SOAJ, Inject 0.25 mg into the skin once a week., Disp: 2 mL, Rfl: 0   Vitamin D, Ergocalciferol, (DRISDOL) 1.25 MG (50000 UNIT) CAPS capsule, Take 50,000 Units by mouth once a week., Disp: , Rfl:   Social History   Tobacco Use  Smoking Status Never  Smokeless Tobacco Never    No Known Allergies Objective:  There were no vitals filed for this visit. There is no height or weight on file to calculate BMI. Constitutional Well developed. Well nourished.  Vascular Dorsalis pedis pulses palpable bilaterally. Posterior tibial pulses palpable bilaterally. Capillary refill normal to all digits.  No cyanosis or clubbing noted. Pedal hair growth normal.  Neurologic Normal speech. Oriented to person, place, and time. Epicritic sensation to light touch grossly present bilaterally.  Dermatologic Nails well groomed and normal in  appearance. No open wounds. No skin lesions.  Orthopedic: Pain on palpation of right Achilles tendon insertion.  Positive Haglund's deformity noted.  Positive Silfverskiold test with gastrocnemius equinus noted.  Hurts at the insertion of the Achilles.  No plantar fascial pain no pain at the posterior tibial tendon peroneal tendon.   Radiographs: 3 views of skeletally mature right foot: Posterior spurring noted with Haglund's deformity.  Flatfoot deformity noted as well.  Midfoot arthritis noted.  No other bony abnormalities identified. Assessment:   1. Pes planovalgus   2. Achilles tendinitis, right leg     Plan:  Patient was evaluated and treated and all questions answered.  Right Achilles tendinitis with Haglund's deformity and positive Silfverskiold with gastrocnemius equinus -All questions and concerns were discussed with the patient in extensive detail -Given that she still has some residual pain she will benefit from a steroid injection I discussed the risk of rupture associated with it she states understanding would like to proceed despite the risks. -A steroid injection was performed at right Kager's fat pad using 1% plain Lidocaine and 10 mg of Kenalog. This was well tolerated.  Pes planovalgus -I explained to patient the etiology of pes planovalgus and relationship with Achilles tendinitis and various treatment options were discussed.  Given patient foot structure in the setting of Achilles tendinitis I believe patient will benefit from custom-made orthotics to help control the hindfoot motion support the arch of the foot and take the stress away from plantar  fascial.  Patient agrees with the plan like to proceed with orthotics -Patient was casted for orthotics with a heel lift quarter inch    No follow-ups on file.

## 2021-12-06 ENCOUNTER — Encounter: Payer: Self-pay | Admitting: Family Medicine

## 2021-12-07 NOTE — Telephone Encounter (Signed)
Please advise 

## 2021-12-08 ENCOUNTER — Other Ambulatory Visit (HOSPITAL_BASED_OUTPATIENT_CLINIC_OR_DEPARTMENT_OTHER): Payer: Self-pay

## 2021-12-08 MED ORDER — WEGOVY 0.25 MG/0.5ML ~~LOC~~ SOAJ
0.2500 mg | SUBCUTANEOUS | 0 refills | Status: DC
  Filled 2021-12-08: qty 2, 28d supply, fill #0

## 2021-12-08 NOTE — Telephone Encounter (Signed)
CAn we verify her current dose of wegovy? Ok to send in new rx for ozempic at the same dose.  Sierra [name]. [name], MD 12/08/2021 11:21 AM

## 2021-12-08 NOTE — Telephone Encounter (Signed)
Spoke to pt asked her if she tried any of the [facility] pharmacies? Pt said no but everyone is out. Tell her [facility] pharmacies have been getting a supply where local pharmacies have not been. Told her I can send Wegovy to one of them to see if she can get it. Pt verbalized understanding and said that is fine. Told her I will send Rx to [facility] gave pt address, and if they do not have it they usually get is pretty quick. Pt verbalized understanding. Rx sent.

## 2021-12-13 ENCOUNTER — Other Ambulatory Visit (HOSPITAL_BASED_OUTPATIENT_CLINIC_OR_DEPARTMENT_OTHER): Payer: Self-pay

## 2021-12-20 ENCOUNTER — Ambulatory Visit (INDEPENDENT_AMBULATORY_CARE_PROVIDER_SITE_OTHER): Payer: BC Managed Care – PPO

## 2021-12-20 DIAGNOSIS — Q666 Other congenital valgus deformities of feet: Secondary | ICD-10-CM

## 2021-12-20 NOTE — Progress Notes (Signed)
Patient presents today to pick up custom molded foot orthotics, diagnosed with pes planovalgus by Dr. Allena Katz.   Orthotics were trimmed to fit into the patient's shoe.   Orthotics were dispensed and fit was satisfactory. Reviewed instructions for break-in and wear. Written instructions given to patient.  Patient will follow up as needed.   [facility] - order # [id number]

## 2021-12-22 ENCOUNTER — Other Ambulatory Visit (HOSPITAL_BASED_OUTPATIENT_CLINIC_OR_DEPARTMENT_OTHER): Payer: Self-pay

## 2022-01-03 ENCOUNTER — Ambulatory Visit: Payer: BC Managed Care – PPO | Admitting: Podiatry

## 2022-01-11 ENCOUNTER — Encounter: Payer: Self-pay | Admitting: Family Medicine

## 2022-01-12 NOTE — Telephone Encounter (Signed)
Ok to send in rx for 0.5mg  weekly for 4 weeks. Would like for her to check in with [place] in a few weeks to let us know how that is working.  Sierra [name]. [name], MD 01/12/2022 11:47 AM

## 2022-01-12 NOTE — Telephone Encounter (Signed)
Please advise 

## 2022-01-17 ENCOUNTER — Other Ambulatory Visit: Payer: Self-pay | Admitting: *Deleted

## 2022-01-17 MED ORDER — WEGOVY 0.5 MG/0.5ML ~~LOC~~ SOAJ: 0.5000 mg | SUBCUTANEOUS | 0 refills | Status: DC

## 2022-01-30 ENCOUNTER — Other Ambulatory Visit (HOSPITAL_BASED_OUTPATIENT_CLINIC_OR_DEPARTMENT_OTHER): Payer: Self-pay

## 2022-01-30 ENCOUNTER — Other Ambulatory Visit: Payer: Self-pay

## 2022-01-30 MED ORDER — WEGOVY 0.5 MG/0.5ML ~~LOC~~ SOAJ
0.5000 mg | SUBCUTANEOUS | 0 refills | Status: DC
  Filled 2022-01-30: qty 2, 28d supply, fill #0

## 2022-01-30 NOTE — Telephone Encounter (Signed)
I called pt and pt stated RX was sent to the wrong pharmacy, RX resent to correct pharmacy.

## 2022-02-06 ENCOUNTER — Other Ambulatory Visit (HOSPITAL_BASED_OUTPATIENT_CLINIC_OR_DEPARTMENT_OTHER): Payer: Self-pay

## 2022-03-06 ENCOUNTER — Encounter: Payer: Self-pay | Admitting: Family Medicine

## 2022-03-08 ENCOUNTER — Other Ambulatory Visit (HOSPITAL_BASED_OUTPATIENT_CLINIC_OR_DEPARTMENT_OTHER): Payer: Self-pay

## 2022-03-08 MED ORDER — WEGOVY 1 MG/0.5ML ~~LOC~~ SOAJ
1.0000 mg | SUBCUTANEOUS | 1 refills | Status: DC
  Filled 2022-03-08: qty 2, 28d supply, fill #0

## 2022-03-08 NOTE — Telephone Encounter (Signed)
Ok to send in rx for 1mg  weekly dose x 4 weeks.  Algis Greenhouse. Jerline Pain, MD 03/08/2022 1:57 PM

## 2022-04-01 ENCOUNTER — Encounter: Payer: Self-pay | Admitting: Family Medicine

## 2022-04-02 ENCOUNTER — Other Ambulatory Visit (HOSPITAL_BASED_OUTPATIENT_CLINIC_OR_DEPARTMENT_OTHER): Payer: Self-pay

## 2022-04-02 ENCOUNTER — Other Ambulatory Visit: Payer: Self-pay | Admitting: *Deleted

## 2022-04-02 MED ORDER — WEGOVY 1.7 MG/0.75ML ~~LOC~~ SOAJ
1.7000 mg | SUBCUTANEOUS | 1 refills | Status: DC
  Filled 2022-04-02: qty 3, 28d supply, fill #0
  Filled 2022-05-02: qty 3, 28d supply, fill #1

## 2022-04-02 NOTE — Telephone Encounter (Signed)
Ok to send in 1.7mg  dose of wegovy for 4 weeks. Would like for her to check in with [place] in about 4 weeks before we go to the next dose.  Katina Degree. [name], MD 04/02/2022 10:28 AM

## 2022-05-02 ENCOUNTER — Other Ambulatory Visit (HOSPITAL_BASED_OUTPATIENT_CLINIC_OR_DEPARTMENT_OTHER): Payer: Self-pay

## 2022-05-03 ENCOUNTER — Other Ambulatory Visit (HOSPITAL_BASED_OUTPATIENT_CLINIC_OR_DEPARTMENT_OTHER): Payer: Self-pay

## 2022-06-20 NOTE — Progress Notes (Signed)
42 y.o. [id number] Single Black or African American Not Hispanic or Latino female here for annual exam.  She is on OCP's, typically she bleeds monthly x 1 day (light). Not currently sexually active, no STD concerns.  No bowel or bladder c/o. No medical changes.   She is on Phentermine for weight loss. She has lost 10 lbs since 12/23.    Patient's last menstrual period was 05/26/2022.          Sexually active: No.  The current method of family planning is OCP (estrogen/progesterone).    Exercising: No.  The patient does not participate in regular exercise at present. Smoker:  no  Health Maintenance: Pap:   01/28/2018 WNL NEG HPV, 2016 negative, negative hpv  History of abnormal Pap:  no MMG:  06/26/22 Density B Bi-rads 1 neg  BMD:   none  Colonoscopy: none  TDaP:  01/02/17  Gardasil: none   reports that she has never smoked. She has never used smokeless tobacco. She reports current alcohol use of about 3.0 standard drinks of alcohol per week. She reports that she does not use drugs. She works in [employer] in [employer].  Family is local, spends a lot of time with her 61 year old niece Junious Dresser.   Past Medical History:  Diagnosis Date   Family history of diabetes mellitus (DM)    History of vitamin D deficiency    Prediabetes 03/09/2019   Prediabetes    Vitamin D deficiency 03/09/2019    No past surgical history on file.  Current Outpatient Medications  Medication Sig Dispense Refill   norethindrone-ethinyl estradiol (JUNEL 1/20) 1-20 MG-MCG tablet Take 1 tablet by mouth daily. 84 tablet 3   phentermine (ADIPEX-P) 37.5 MG tablet Take 37.5 mg by mouth daily before breakfast.     Vitamin D, Ergocalciferol, (DRISDOL) 1.25 MG (50000 UNIT) CAPS capsule Take 50,000 Units by mouth once a week.     No current facility-administered medications for this visit.    Family History  Problem Relation Age of Onset   Diabetes Father        type 2   Cancer Maternal Grandmother 66       related  to smoking, unsure type ? colon   Breast cancer Neg Hx     Review of Systems  All other systems reviewed and are negative.   Exam:   BP 122/70   Pulse 77   Ht 5' 11.5" (1.816 m)   Wt 277 lb (125.6 kg)   LMP 05/26/2022   SpO2 100%   BMI 38.10 kg/m   Weight change: @WEIGHTCHANGE$ @ Height:   Height: 5' 11.5" (181.6 cm)  Ht Readings from Last 3 Encounters:  06/27/22 5' 11.5" (1.816 m)  11/13/21 6' (1.829 m)  10/26/21 6' (1.829 m)    General appearance: alert, cooperative and appears stated age Head: Normocephalic, without obvious abnormality, atraumatic Neck: no adenopathy, supple, symmetrical, trachea midline and thyroid normal to inspection and palpation Lungs: clear to auscultation bilaterally Cardiovascular: regular rate and rhythm Breasts: normal appearance, no masses or tenderness Abdomen: soft, non-tender; non distended,  no masses,  no organomegaly Extremities: extremities normal, atraumatic, no cyanosis or edema Skin: Skin color, texture, turgor normal. No rashes or lesions Lymph nodes: Cervical, supraclavicular, and axillary nodes normal. No abnormal inguinal nodes palpated Neurologic: Grossly normal   Pelvic: External genitalia:  no lesions              Urethra:  normal appearing urethra with no masses,  tenderness or lesions              Bartholins and Skenes: normal                 Vagina: normal appearing vagina with normal color and discharge, no lesions              Cervix: no lesions               Bimanual Exam:  Uterus:   no masses or tenderness              Adnexa: no mass, fullness, tenderness               Rectovaginal: Confirms               Anus:  normal sphincter tone, no lesions  Gae Dry, CMA chaperoned for the exam.  1. Well woman exam Discussed breast self exam Discussed calcium and vit D intake Mammogram UTD Labs with primary  2. Screening for cervical cancer - Cytology - PAP  3. Encounter for surveillance of contraceptive  pills Doing well - norethindrone-ethinyl estradiol (JUNEL 1/20) 1-20 MG-MCG tablet; Take 1 tablet by mouth daily.  Dispense: 84 tablet; Refill: 3  4. Immunization counseling Will start the gardasil series - HPV 9-valent vaccine,Recombinat

## 2022-06-21 ENCOUNTER — Other Ambulatory Visit: Payer: Self-pay | Admitting: Obstetrics and Gynecology

## 2022-06-21 DIAGNOSIS — Z1231 Encounter for screening mammogram for malignant neoplasm of breast: Secondary | ICD-10-CM

## 2022-06-25 ENCOUNTER — Other Ambulatory Visit: Payer: Self-pay | Admitting: Obstetrics and Gynecology

## 2022-06-25 DIAGNOSIS — Z3041 Encounter for surveillance of contraceptive pills: Secondary | ICD-10-CM

## 2022-06-25 NOTE — Telephone Encounter (Signed)
FYI. Last AEX 06/22/2021--scheduled for 06/27/2022. Last mammo 04/12/2021- neg birads 1, scheduled for 06/26/2022.  Per pharmacy, last refill on 04/06/2022. Should last her until appt. Will refuse request for now.

## 2022-06-26 ENCOUNTER — Ambulatory Visit
Admission: RE | Admit: 2022-06-26 | Discharge: 2022-06-26 | Disposition: A | Discharge status: Home / Self Care | Payer: BC Managed Care – PPO | Source: Ambulatory Visit | Attending: Obstetrics and Gynecology | Admitting: Obstetrics and Gynecology

## 2022-06-26 DIAGNOSIS — Z1231 Encounter for screening mammogram for malignant neoplasm of breast: Secondary | ICD-10-CM | POA: Diagnosis not present

## 2022-06-27 ENCOUNTER — Encounter: Payer: Self-pay | Admitting: Obstetrics and Gynecology

## 2022-06-27 ENCOUNTER — Ambulatory Visit (INDEPENDENT_AMBULATORY_CARE_PROVIDER_SITE_OTHER): Payer: BC Managed Care – PPO | Admitting: Obstetrics and Gynecology

## 2022-06-27 ENCOUNTER — Other Ambulatory Visit (HOSPITAL_COMMUNITY)
Admission: RE | Admit: 2022-06-27 | Discharge: 2022-06-27 | Disposition: A | Discharge status: Home / Self Care | Payer: BC Managed Care – PPO | Source: Ambulatory Visit | Attending: Obstetrics and Gynecology | Admitting: Obstetrics and Gynecology

## 2022-06-27 VITALS — BP 122/70 | HR 77 | Ht 71.5 in | Wt 277.0 lb

## 2022-06-27 DIAGNOSIS — Z23 Encounter for immunization: Secondary | ICD-10-CM

## 2022-06-27 DIAGNOSIS — Z124 Encounter for screening for malignant neoplasm of cervix: Secondary | ICD-10-CM | POA: Diagnosis not present

## 2022-06-27 DIAGNOSIS — Z01419 Encounter for gynecological examination (general) (routine) without abnormal findings: Secondary | ICD-10-CM

## 2022-06-27 DIAGNOSIS — Z7185 Encounter for immunization safety counseling: Secondary | ICD-10-CM

## 2022-06-27 DIAGNOSIS — Z3041 Encounter for surveillance of contraceptive pills: Secondary | ICD-10-CM

## 2022-06-27 MED ORDER — NORETHINDRONE ACET-ETHINYL EST 1-20 MG-MCG PO TABS: 1.0000 | ORAL_TABLET | Freq: Every day | ORAL | 3 refills | Status: DC

## 2022-06-27 NOTE — Patient Instructions (Signed)

## 2022-06-29 IMAGING — MG MM DIGITAL SCREENING BILAT W/ TOMO AND CAD
8 series · 8 of 24 positions shown · non-contrast
Comparison: Previous exam(s).

CLINICAL DATA: Screening.

EXAM:
DIGITAL SCREENING BILATERAL MAMMOGRAM WITH TOMOSYNTHESIS AND CAD
TECHNIQUE: Bilateral screening digital craniocaudal and mediolateral oblique
mammograms were obtained. Bilateral screening digital breast
tomosynthesis was performed. The images were evaluated with
computer-aided detection.

[R MLO synth-2D]
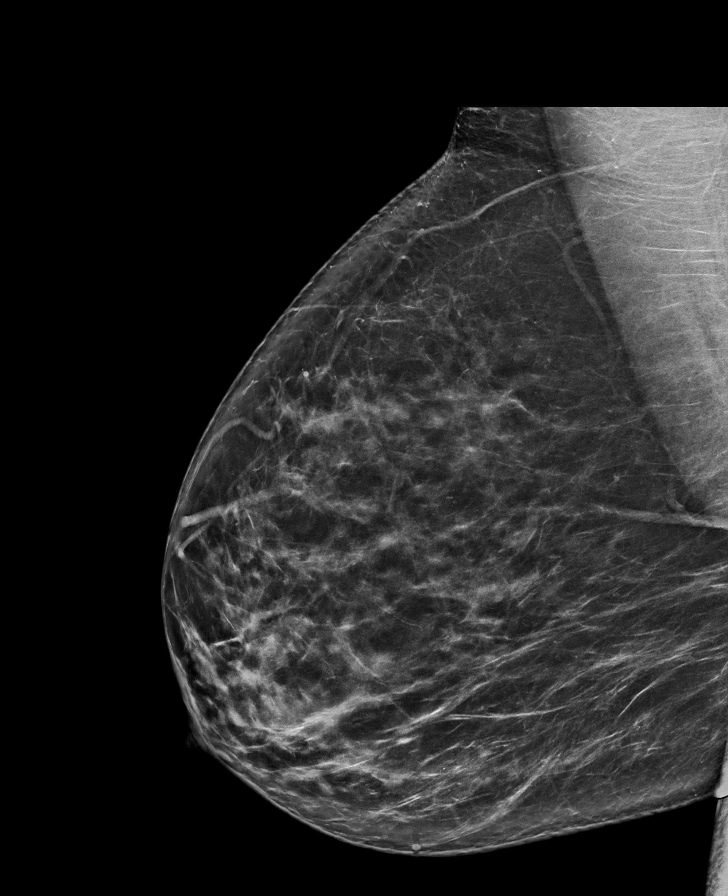

[L MLO synth-2D]
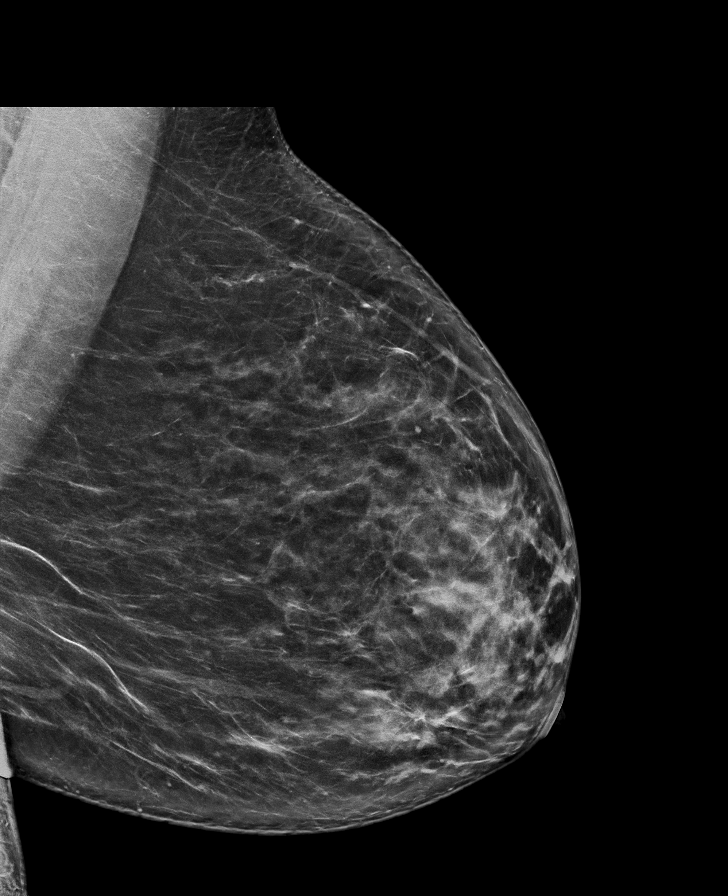

[R CC synth-2D]
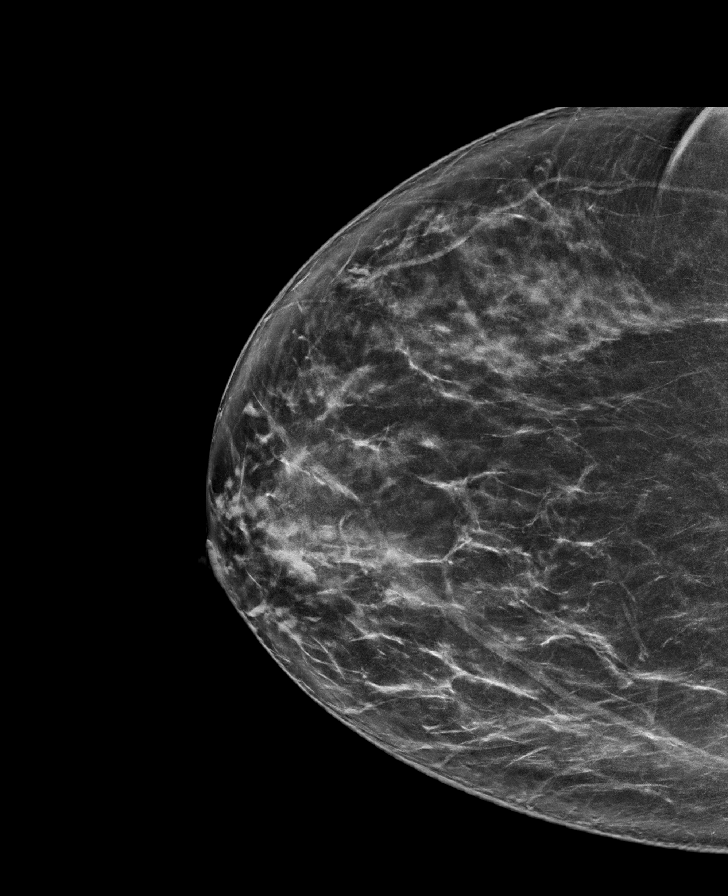

[L CC synth-2D]
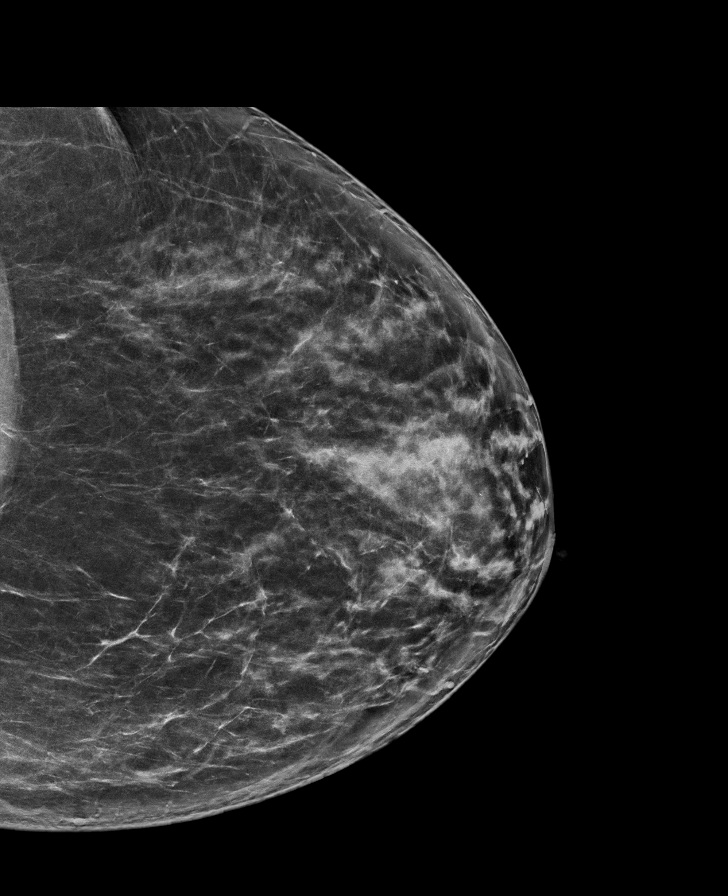

[R MLO tomo · tomo slice 45/89.0]
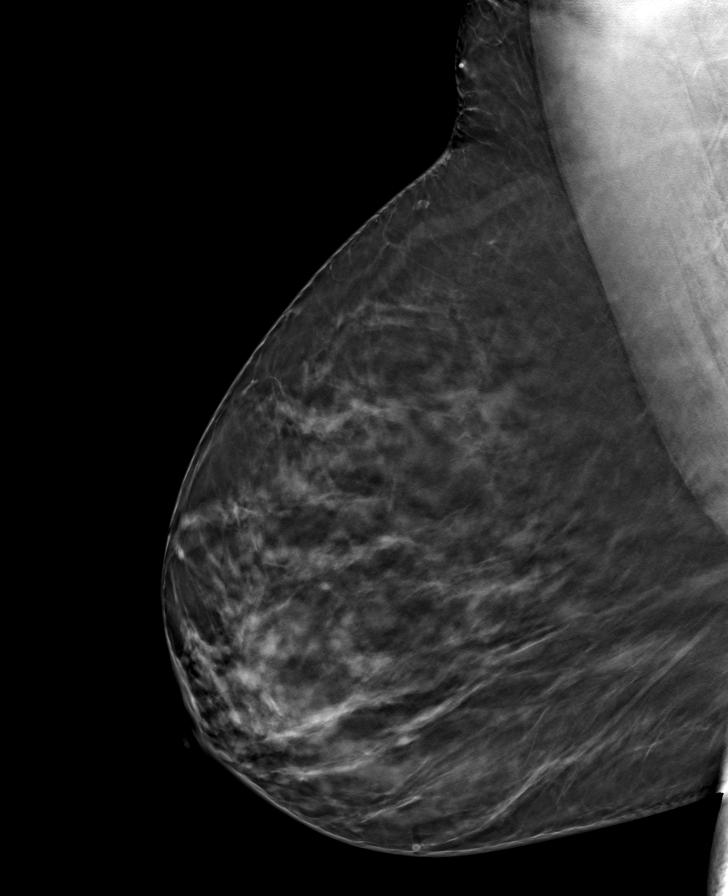

[R CC tomo · tomo slice 43/85.0]
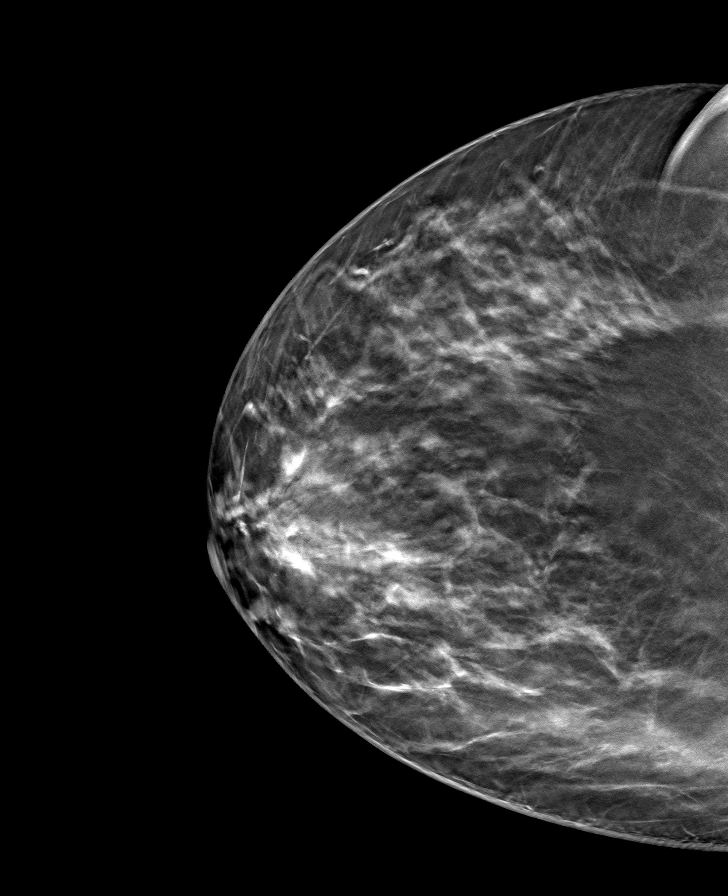

[L CC tomo · tomo slice 43/86.0]
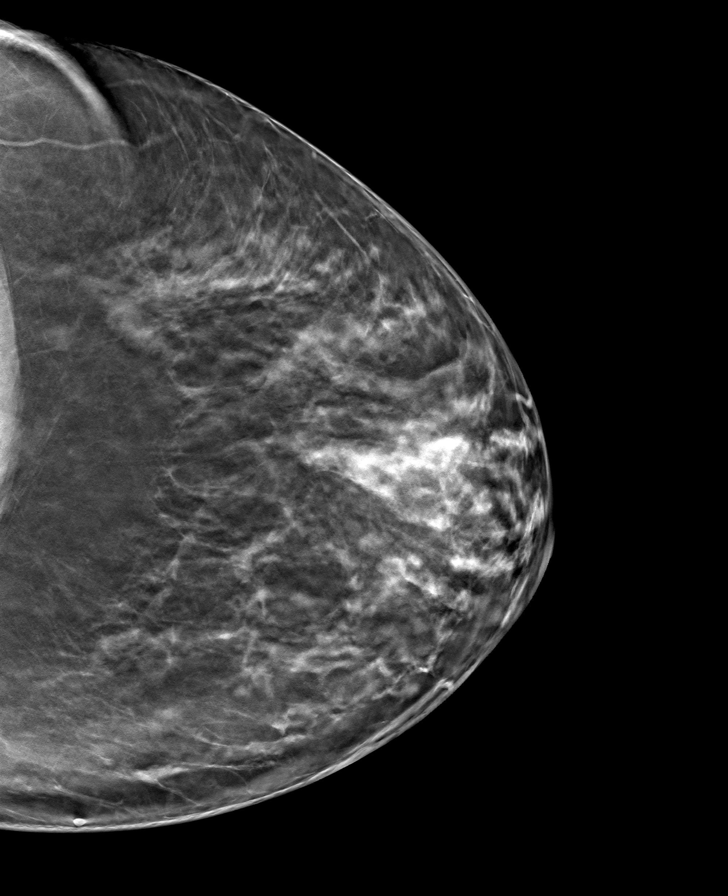

[L MLO tomo · tomo slice 47/93.0]
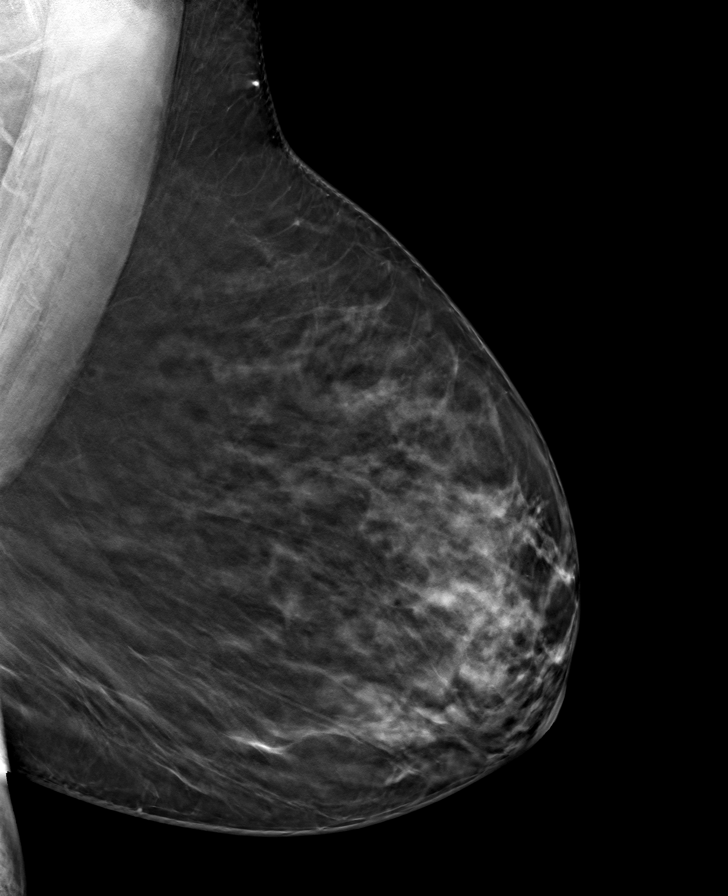

[8 of 24 positions shown; findings below may reference images not displayed]

ACR Breast Density Category c: The breast tissue is heterogeneously
dense, which may obscure small masses.
FINDINGS: There are no findings suspicious for malignancy.
IMPRESSION: No mammographic evidence of malignancy. A result letter of this
screening mammogram will be mailed directly to the patient.

RECOMMENDATION:
Screening mammogram in one year. (Code:Q3-W-BC3)

BI-RADS CATEGORY  1: Negative.

## 2022-07-03 LAB — CYTOLOGY - PAP
Comment: NEGATIVE
Diagnosis: NEGATIVE
Diagnosis: REACTIVE
High risk HPV: NEGATIVE

## 2022-08-30 ENCOUNTER — Ambulatory Visit (INDEPENDENT_AMBULATORY_CARE_PROVIDER_SITE_OTHER): Payer: BC Managed Care – PPO

## 2022-08-30 DIAGNOSIS — Z23 Encounter for immunization: Secondary | ICD-10-CM | POA: Diagnosis not present

## 2022-08-30 NOTE — Progress Notes (Signed)
Gardasil vaccination given IM RD.  Patient tolerated injection well.  Last AEX 06/27/22 JJ.

## 2022-10-31 DIAGNOSIS — R5383 Other fatigue: Secondary | ICD-10-CM | POA: Diagnosis not present

## 2022-10-31 DIAGNOSIS — Z3200 Encounter for pregnancy test, result unknown: Secondary | ICD-10-CM | POA: Diagnosis not present

## 2022-10-31 DIAGNOSIS — R0602 Shortness of breath: Secondary | ICD-10-CM | POA: Diagnosis not present

## 2022-10-31 DIAGNOSIS — E559 Vitamin D deficiency, unspecified: Secondary | ICD-10-CM | POA: Diagnosis not present

## 2022-10-31 DIAGNOSIS — R635 Abnormal weight gain: Secondary | ICD-10-CM | POA: Diagnosis not present

## 2022-10-31 DIAGNOSIS — Z79899 Other long term (current) drug therapy: Secondary | ICD-10-CM | POA: Diagnosis not present

## 2022-10-31 DIAGNOSIS — Z6838 Body mass index (BMI) 38.0-38.9, adult: Secondary | ICD-10-CM | POA: Diagnosis not present

## 2022-11-14 DIAGNOSIS — R7303 Prediabetes: Secondary | ICD-10-CM | POA: Diagnosis not present

## 2022-11-14 DIAGNOSIS — R635 Abnormal weight gain: Secondary | ICD-10-CM | POA: Diagnosis not present

## 2022-11-14 DIAGNOSIS — Z3200 Encounter for pregnancy test, result unknown: Secondary | ICD-10-CM | POA: Diagnosis not present

## 2022-11-14 DIAGNOSIS — Z6838 Body mass index (BMI) 38.0-38.9, adult: Secondary | ICD-10-CM | POA: Diagnosis not present

## 2022-11-16 ENCOUNTER — Encounter: Payer: BC Managed Care – PPO | Admitting: Family Medicine

## 2022-11-22 ENCOUNTER — Ambulatory Visit (INDEPENDENT_AMBULATORY_CARE_PROVIDER_SITE_OTHER): Payer: BC Managed Care – PPO | Admitting: Family Medicine

## 2022-11-22 ENCOUNTER — Other Ambulatory Visit: Payer: Self-pay | Admitting: Family Medicine

## 2022-11-22 VITALS — BP 118/72 | HR 104 | Temp 97.8°F | Ht 71.5 in | Wt 279.0 lb

## 2022-11-22 DIAGNOSIS — Z13220 Encounter for screening for lipoid disorders: Secondary | ICD-10-CM

## 2022-11-22 DIAGNOSIS — Z0001 Encounter for general adult medical examination with abnormal findings: Secondary | ICD-10-CM | POA: Diagnosis not present

## 2022-11-22 DIAGNOSIS — R7303 Prediabetes: Secondary | ICD-10-CM | POA: Diagnosis not present

## 2022-11-22 DIAGNOSIS — E559 Vitamin D deficiency, unspecified: Secondary | ICD-10-CM | POA: Diagnosis not present

## 2022-11-22 LAB — COMPREHENSIVE METABOLIC PANEL
ALT: 33 U/L (ref 0–35)
AST: 17 U/L (ref 0–37)
Albumin: 4.4 g/dL (ref 3.5–5.2)
Alkaline Phosphatase: 65 U/L (ref 39–117)
BUN: 14 mg/dL (ref 6–23)
CO2: 24 mEq/L (ref 19–32)
Calcium: 9.7 mg/dL (ref 8.4–10.5)
Chloride: 105 mEq/L (ref 96–112)
Creatinine, Ser: 0.69 mg/dL (ref 0.40–1.20)
GFR: 105.61 mL/min (ref >60.00)
Glucose, Bld: 90 mg/dL (ref 70–99)
Potassium: 4 mEq/L (ref 3.5–5.1)
Sodium: 139 mEq/L (ref 135–145)
Total Bilirubin: 0.6 mg/dL (ref 0.2–1.2)
Total Protein: 6.9 g/dL (ref 6.0–8.3)

## 2022-11-22 LAB — LIPID PANEL
Cholesterol: 141 mg/dL (ref 0–200)
HDL: 62.2 mg/dL (ref >39.00)
LDL Cholesterol: 64 mg/dL (ref 0–99)
NonHDL: 78.98
Total CHOL/HDL Ratio: 2
Triglycerides: 76 mg/dL (ref 0.0–149.0)
VLDL: 15.2 mg/dL (ref 0.0–40.0)

## 2022-11-22 LAB — TSH: TSH: 0.98 u[IU]/mL (ref 0.35–5.50)

## 2022-11-22 LAB — CBC
HCT: 40.6 % (ref 36.0–46.0)
Hemoglobin: 12.6 g/dL (ref 12.0–15.0)
MCHC: 31.1 g/dL (ref 30.0–36.0)
MCV: 76 fl — ABNORMAL LOW (ref 78.0–100.0)
Platelets: 213 10*3/uL (ref 150.0–400.0)
RBC: 5.34 Mil/uL — ABNORMAL HIGH (ref 3.87–5.11)
RDW: 14.1 % (ref 11.5–15.5)
WBC: 6.4 10*3/uL (ref 4.0–10.5)

## 2022-11-22 LAB — HEMOGLOBIN A1C: Hgb A1c MFr Bld: 5.9 % (ref 4.6–6.5)

## 2022-11-22 LAB — VITAMIN D 25 HYDROXY (VIT D DEFICIENCY, FRACTURES): VITD: 48.15 ng/mL (ref 30.00–100.00)

## 2022-11-22 MED ORDER — ZEPBOUND 2.5 MG/0.5ML ~~LOC~~ SOAJ: 2.5000 mg | SUBCUTANEOUS | 0 refills | Status: DC

## 2022-11-22 NOTE — Progress Notes (Signed)
Chief Complaint:  Sierra [name] is a 42 y.o. female who presents today for her annual comprehensive physical exam.    Assessment/Plan:  Chronic Problems Addressed Today: Morbid obesity (HCC) BMI 30 today with comorbidities.  Did not have much benefit with Wegovy.  She has recently established with weight loss clinic and they started her on phentermine.  So far seems to be tolerating this well.  We did discuss alternative medication options to help with weight management.  Would be reasonable to try Zepbound.  Hopefully insurance will pay for this.  Start 2.5 mg weekly and increase as tolerated.  She will send [place] a message in a few weeks via MyChart and we can titrate as needed.  Prediabetes Check A1c.  Will be starting Zepbound today as above.   Vitamin D deficiency Check vitamin D.   Preventative Healthcare: Gets womens health through [place].  Up-to-date on Pap smear.  Due for colonoscopy next year.  Check labs today.  Patient Counseling(The following topics were reviewed and/or handout was given):  -Nutrition: Stressed importance of moderation in sodium/caffeine intake, saturated fat and cholesterol, caloric balance, sufficient intake of fresh fruits, vegetables, and fiber.  -Stressed the importance of regular exercise.   -Substance Abuse: Discussed cessation/primary prevention of tobacco, alcohol, or other drug use; driving or other dangerous activities under the influence; availability of treatment for abuse.   -Injury prevention: Discussed safety belts, safety helmets, smoke detector, smoking near bedding or upholstery.   -Sexuality: Discussed sexually transmitted diseases, partner selection, use of condoms, avoidance of unintended pregnancy and contraceptive alternatives.   -Dental health: Discussed importance of regular tooth brushing, flossing, and dental visits.  -Health maintenance and immunizations reviewed. Please refer to Health maintenance section.  Return to care in 1 year  for next preventative visit.     Subjective:  HPI:  She has no acute complaints today.  Last seen here about a year ago.  Was started on Wegovy to help with weight loss.  Unfortunately this did not give her much benefit and she discontinued this several months ago.  She did establish with weight loss clinic recently.  They started her on phentermine which does seem to be going well.  I have also try to get her back on Wegovy which was just recently approved by her insurance.  She did have blood work done there that did show that she was in the prediabetic range however she is okay with getting repeat labs done today.  Lifestyle Diet: None specific.  Exercise: None specific.      11/22/2022   10:35 AM  Depression screen PHQ 2/9  Decreased Interest 0  Down, Depressed, Hopeless 0  PHQ - 2 Score 0  Altered sleeping 0  Tired, decreased energy 0  Change in appetite 0  Feeling bad or failure about yourself  0  Trouble concentrating 0  Moving slowly or fidgety/restless 0  Suicidal thoughts 0  PHQ-9 Score 0  Difficult doing work/chores Not difficult at all    Health Maintenance Due  Topic Date Due   HPV VACCINES (3 - 3-dose SCDM series) 12/26/2022     ROS: Per HPI, otherwise a complete review of systems was negative.   PMH:  The following were reviewed and entered/updated in epic: Past Medical History:  Diagnosis Date   Family history of diabetes mellitus (DM)    History of vitamin D deficiency    Prediabetes 03/09/2019   Prediabetes    Vitamin D deficiency 03/09/2019  Patient Active Problem List   Diagnosis Date Noted   Morbid obesity (HCC) 11/13/2021   Prediabetes 03/09/2019   Vitamin D deficiency 03/09/2019   No past surgical history on file.  Family History  Problem Relation Age of Onset   Diabetes Father        type 2   Cancer Maternal Grandmother 15       related to smoking, unsure type ? colon   Breast cancer Neg Hx     Medications- reviewed and  updated Current Outpatient Medications  Medication Sig Dispense Refill   norethindrone-ethinyl estradiol (JUNEL 1/20) 1-20 MG-MCG tablet Take 1 tablet by mouth daily. 84 tablet 3   phentermine (ADIPEX-P) 37.5 MG tablet Take 37.5 mg by mouth daily before breakfast.     tirzepatide (ZEPBOUND) 2.5 MG/0.5ML Pen Inject 2.5 mg into the skin once a week. 2 mL 0   Vitamin D, Ergocalciferol, (DRISDOL) 1.25 MG (50000 UNIT) CAPS capsule Take 50,000 Units by mouth once a week.     No current facility-administered medications for this visit.    Allergies-reviewed and updated No Known Allergies  Social History   Socioeconomic History   Marital status: Single    Spouse name: Not on file   Number of children: 0   Years of education: Not on file   Highest education level: Not on file  Occupational History    Employer: [employer]  Tobacco Use   Smoking status: Never   Smokeless tobacco: Never  Vaping Use   Vaping status: Never Used  Substance and Sexual Activity   Alcohol use: Yes    Alcohol/week: 3.0 standard drinks of alcohol    Types: 3 Glasses of wine per week    Comment: wine and occ mixed drink   Drug use: No   Sexual activity: Yes    Partners: Male    Birth control/protection: Pill  Other Topics Concern   Not on file  Social History Narrative   ** Merged History Encounter **       Social Determinants of Health   Financial Resource Strain: Not on file  Food Insecurity: Not on file  Transportation Needs: Not on file  Physical Activity: Not on file  Stress: Not on file  Social Connections: Not on file        Objective:  Physical Exam: BP 118/72 (BP Location: Right Arm, Patient Position: Sitting, Cuff Size: Large)   Pulse (!) 104   Temp 97.8 F (36.6 C) (Temporal)   Ht 5' 11.5" (1.816 m)   Wt 279 lb (126.6 kg)   SpO2 99%   BMI 38.37 kg/m   Body mass index is 38.37 kg/m. Wt Readings from Last 3 Encounters:  11/22/22 279 lb (126.6 kg)  06/27/22 277 lb (125.6  kg)  11/13/21 287 lb 6.4 oz (130.4 kg)   Gen: NAD, resting comfortably HEENT: TMs normal bilaterally. OP clear. No thyromegaly noted.  CV: RRR with no murmurs appreciated Pulm: NWOB, CTAB with no crackles, wheezes, or rhonchi GI: Normal bowel sounds present. Soft, Nontender, Nondistended. MSK: no edema, cyanosis, or clubbing noted Skin: warm, dry Neuro: CN2-12 grossly intact. Strength 5/5 in upper and lower extremities. Reflexes symmetric and intact bilaterally.  Psych: Normal affect and thought content     Neko Boyajian M. [name], MD 11/22/2022 11:01 AM

## 2022-11-22 NOTE — Assessment & Plan Note (Signed)
Check A1c.  Will be starting Zepbound today as above.

## 2022-11-22 NOTE — Assessment & Plan Note (Signed)
Check vitamin D. 

## 2022-11-22 NOTE — Patient Instructions (Signed)
It was very nice to see you today!  We will check blood work.  We will try Zepbound.  This is a more effective medication than the [facility].  Please continue to work on your diet and exercise.  Return in about 1 year (around 11/22/2023) for Annual Physical.   Take care, Dr [name]  PLEASE NOTE:  If you had any lab tests, please let us know if you have not heard back within a few days. You may see your results on mychart before we have a chance to review them but we will give you a call once they are reviewed by [place].   If we ordered any referrals today, please let us know if you have not heard from their office within the next week.   If you had any urgent prescriptions sent in today, please check with the pharmacy within an hour of our visit to make sure the prescription was transmitted appropriately.   Please try these tips to maintain a healthy lifestyle:  Eat at least 3 REAL meals and 1-2 snacks per day.  Aim for no more than 5 hours between eating.  If you eat breakfast, please do so within one hour of getting up.   Each meal should contain half fruits/vegetables, one quarter protein, and one quarter carbs (no bigger than a computer mouse)  Cut down on sweet beverages. This includes juice, soda, and sweet tea.   Drink at least 1 glass of water with each meal and aim for at least 8 glasses per day  Exercise at least 150 minutes every week.    Preventive Care 80-82 Years Old, Female Preventive care refers to lifestyle choices and visits with your health care provider that can promote health and wellness. Preventive care visits are also called wellness exams. What can I expect for my preventive care visit? Counseling Your health care provider may ask you questions about your: Medical history, including: Past medical problems. Family medical history. Pregnancy history. Current health, including: Menstrual cycle. Method of birth control. Emotional well-being. Home life and  relationship well-being. Sexual activity and sexual health. Lifestyle, including: Alcohol, nicotine or tobacco, and drug use. Access to firearms. Diet, exercise, and sleep habits. Work and work Astronomer. Sunscreen use. Safety issues such as seatbelt and bike helmet use. Physical exam Your health care provider will check your: Height and weight. These may be used to calculate your BMI (body mass index). BMI is a measurement that tells if you are at a healthy weight. Waist circumference. This measures the distance around your waistline. This measurement also tells if you are at a healthy weight and may help predict your risk of certain diseases, such as type 2 diabetes and high blood pressure. Heart rate and blood pressure. Body temperature. Skin for abnormal spots. What immunizations do I need?  Vaccines are usually given at various ages, according to a schedule. Your health care provider will recommend vaccines for you based on your age, medical history, and lifestyle or other factors, such as travel or where you work. What tests do I need? Screening Your health care provider may recommend screening tests for certain conditions. This may include: Lipid and cholesterol levels. Diabetes screening. This is done by checking your blood sugar (glucose) after you have not eaten for a while (fasting). Pelvic exam and Pap test. Hepatitis B test. Hepatitis C test. HIV (human immunodeficiency virus) test. STI (sexually transmitted infection) testing, if you are at risk. Lung cancer screening. Colorectal cancer screening. Mammogram. Talk  with your health care provider about when you should start having regular mammograms. This may depend on whether you have a family history of breast cancer. BRCA-related cancer screening. This may be done if you have a family history of breast, ovarian, tubal, or peritoneal cancers. Bone density scan. This is done to screen for osteoporosis. Talk with your  health care provider about your test results, treatment options, and if necessary, the need for more tests. Follow these instructions at home: Eating and drinking  Eat a diet that includes fresh fruits and vegetables, whole grains, lean protein, and low-fat dairy products. Take vitamin and mineral supplements as recommended by your health care provider. Do not drink alcohol if: Your health care provider tells you not to drink. You are pregnant, may be pregnant, or are planning to become pregnant. If you drink alcohol: Limit how much you have to 0-1 drink a day. Know how much alcohol is in your drink. In the [place] one drink equals one 12 oz bottle of beer (355 mL), one 5 oz glass of wine (148 mL), or one 1 oz glass of hard liquor (44 mL). Lifestyle Brush your teeth every morning and night with fluoride toothpaste. Floss one time each day. Exercise for at least 30 minutes 5 or more days each week. Do not use any products that contain nicotine or tobacco. These products include cigarettes, chewing tobacco, and vaping devices, such as e-cigarettes. If you need help quitting, ask your health care provider. Do not use drugs. If you are sexually active, practice safe sex. Use a condom or other form of protection to prevent STIs. If you do not wish to become pregnant, use a form of birth control. If you plan to become pregnant, see your health care provider for a prepregnancy visit. Take aspirin only as told by your health care provider. Make sure that you understand how much to take and what form to take. Work with your health care provider to find out whether it is safe and beneficial for you to take aspirin daily. Find healthy ways to manage stress, such as: Meditation, yoga, or listening to music. Journaling. Talking to a trusted person. Spending time with friends and family. Minimize exposure to UV radiation to reduce your risk of skin cancer. Safety Always wear your seat belt while driving  or riding in a vehicle. Do not drive: If you have been drinking alcohol. Do not ride with someone who has been drinking. When you are tired or distracted. While texting. If you have been using any mind-altering substances or drugs. Wear a helmet and other protective equipment during sports activities. If you have firearms in your house, make sure you follow all gun safety procedures. Seek help if you have been physically or sexually abused. What's next? Visit your health care provider once a year for an annual wellness visit. Ask your health care provider how often you should have your eyes and teeth checked. Stay up to date on all vaccines. This information is not intended to replace advice given to you by your health care provider. Make sure you discuss any questions you have with your health care provider. Document Revised: 10/26/2020 Document Reviewed: 10/26/2020 Elsevier Patient Education  2024 [employer].

## 2022-11-22 NOTE — Assessment & Plan Note (Signed)
BMI 30 today with comorbidities.  Did not have much benefit with Wegovy.  She has recently established with weight loss clinic and they started her on phentermine.  So far seems to be tolerating this well.  We did discuss alternative medication options to help with weight management.  Would be reasonable to try Zepbound.  Hopefully insurance will pay for this.  Start 2.5 mg weekly and increase as tolerated.  She will send [place] a message in a few weeks via MyChart and we can titrate as needed.

## 2022-11-23 NOTE — Progress Notes (Signed)
Her A1c is in the prediabetic range but stable compared to the last time that we checked.  The rest of her labs are all stable.  Do not need to make any changes to her treatment plan at this time.  She should have improvement with numbers with the Zepbound but she should also continue to work on diet and exercise.  We can recheck her A1c in a few months.  We can recheck everything else in a year or so.

## 2022-11-29 ENCOUNTER — Encounter: Payer: Self-pay | Admitting: Family Medicine

## 2022-11-29 ENCOUNTER — Other Ambulatory Visit: Payer: Self-pay

## 2022-11-29 ENCOUNTER — Other Ambulatory Visit (HOSPITAL_BASED_OUTPATIENT_CLINIC_OR_DEPARTMENT_OTHER): Payer: Self-pay

## 2022-11-29 MED ORDER — ZEPBOUND 2.5 MG/0.5ML ~~LOC~~ SOAJ
2.5000 mg | SUBCUTANEOUS | 0 refills | Status: DC
Start: 2022-11-29 — End: 2023-01-23
  Filled 2022-11-29: qty 2, 28d supply, fill #0

## 2022-11-30 ENCOUNTER — Other Ambulatory Visit (HOSPITAL_BASED_OUTPATIENT_CLINIC_OR_DEPARTMENT_OTHER): Payer: Self-pay

## 2022-11-30 ENCOUNTER — Encounter (HOSPITAL_BASED_OUTPATIENT_CLINIC_OR_DEPARTMENT_OTHER): Payer: Self-pay

## 2022-12-05 ENCOUNTER — Other Ambulatory Visit (HOSPITAL_COMMUNITY): Payer: Self-pay

## 2022-12-05 ENCOUNTER — Other Ambulatory Visit (HOSPITAL_BASED_OUTPATIENT_CLINIC_OR_DEPARTMENT_OTHER): Payer: Self-pay

## 2022-12-05 ENCOUNTER — Telehealth: Payer: Self-pay | Admitting: Pharmacy Technician

## 2022-12-05 NOTE — Telephone Encounter (Signed)
Pharmacy Patient Advocate Encounter  Received notification from [facility] that Prior Authorization for Zepbound 2.5MG /0.5ML pen-injectors has been APPROVED from 12/05/2022 to 08/02/2023. Ran test claim, Copay is $0.00.  PA #/Case ID/Reference #: [id number]

## 2022-12-18 ENCOUNTER — Other Ambulatory Visit (HOSPITAL_BASED_OUTPATIENT_CLINIC_OR_DEPARTMENT_OTHER): Payer: Self-pay

## 2022-12-18 DIAGNOSIS — R7303 Prediabetes: Secondary | ICD-10-CM | POA: Diagnosis not present

## 2022-12-18 DIAGNOSIS — Z3200 Encounter for pregnancy test, result unknown: Secondary | ICD-10-CM | POA: Diagnosis not present

## 2022-12-18 DIAGNOSIS — R635 Abnormal weight gain: Secondary | ICD-10-CM | POA: Diagnosis not present

## 2022-12-18 DIAGNOSIS — Z6837 Body mass index (BMI) 37.0-37.9, adult: Secondary | ICD-10-CM | POA: Diagnosis not present

## 2022-12-18 MED ORDER — ZEPBOUND 5 MG/0.5ML ~~LOC~~ SOAJ
5.0000 mg | SUBCUTANEOUS | 0 refills | Status: DC
  Filled 2022-12-18: qty 2, 28d supply, fill #0

## 2022-12-21 ENCOUNTER — Other Ambulatory Visit (HOSPITAL_BASED_OUTPATIENT_CLINIC_OR_DEPARTMENT_OTHER): Payer: Self-pay

## 2022-12-31 ENCOUNTER — Ambulatory Visit (INDEPENDENT_AMBULATORY_CARE_PROVIDER_SITE_OTHER): Payer: BC Managed Care – PPO

## 2022-12-31 DIAGNOSIS — Z23 Encounter for immunization: Secondary | ICD-10-CM

## 2023-01-04 ENCOUNTER — Other Ambulatory Visit: Payer: Self-pay | Admitting: Family Medicine

## 2023-01-04 ENCOUNTER — Other Ambulatory Visit (HOSPITAL_BASED_OUTPATIENT_CLINIC_OR_DEPARTMENT_OTHER): Payer: Self-pay

## 2023-01-04 NOTE — Telephone Encounter (Signed)
We do not prescribe her phentermine. We have her on zepbound. Please make sure she has a follow up visit for this scheduled soon.  Sierra [name]. [name], MD 01/04/2023 1:23 PM

## 2023-01-12 IMAGING — DX DG CHEST 2V
2 series · 2 of 2 positions shown · non-contrast
Comparison: None Available.

CLINICAL DATA: Intermittent upper chest pain for 3 weeks.

EXAM:
CHEST - 2 VIEW

[chest pa]
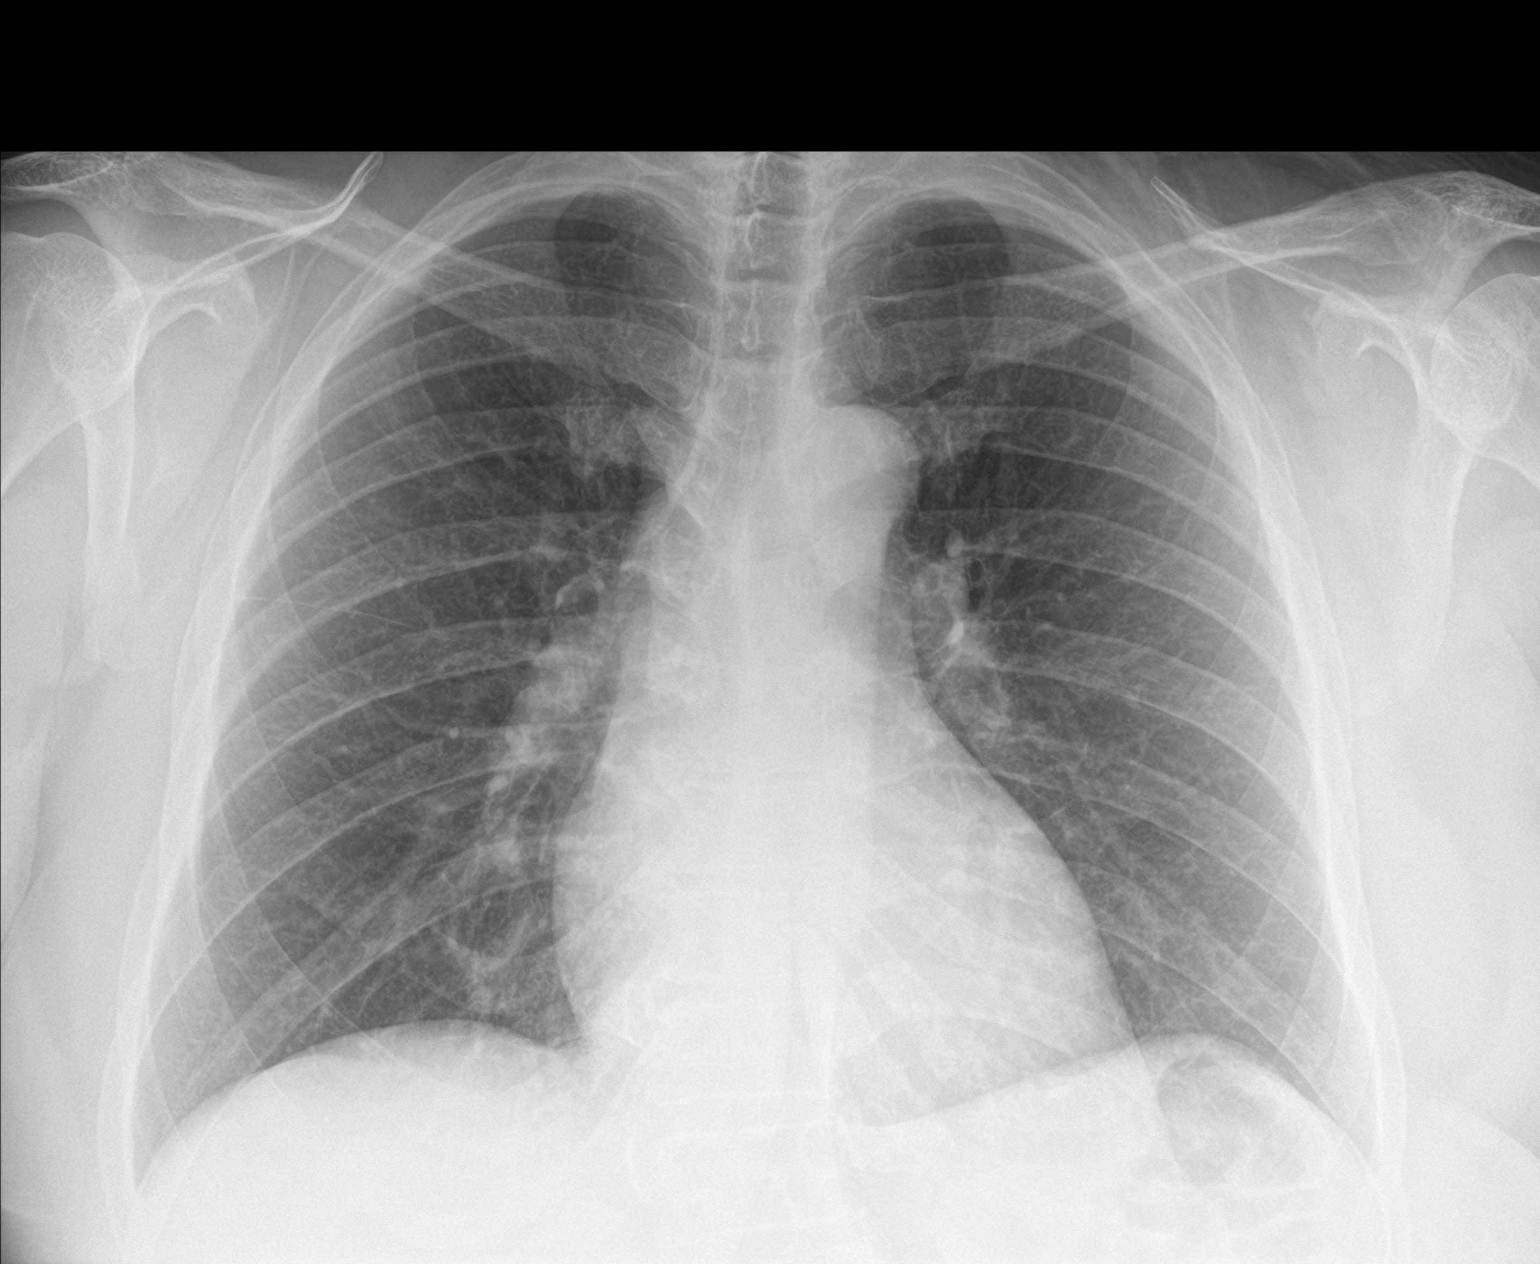

[chest lat]
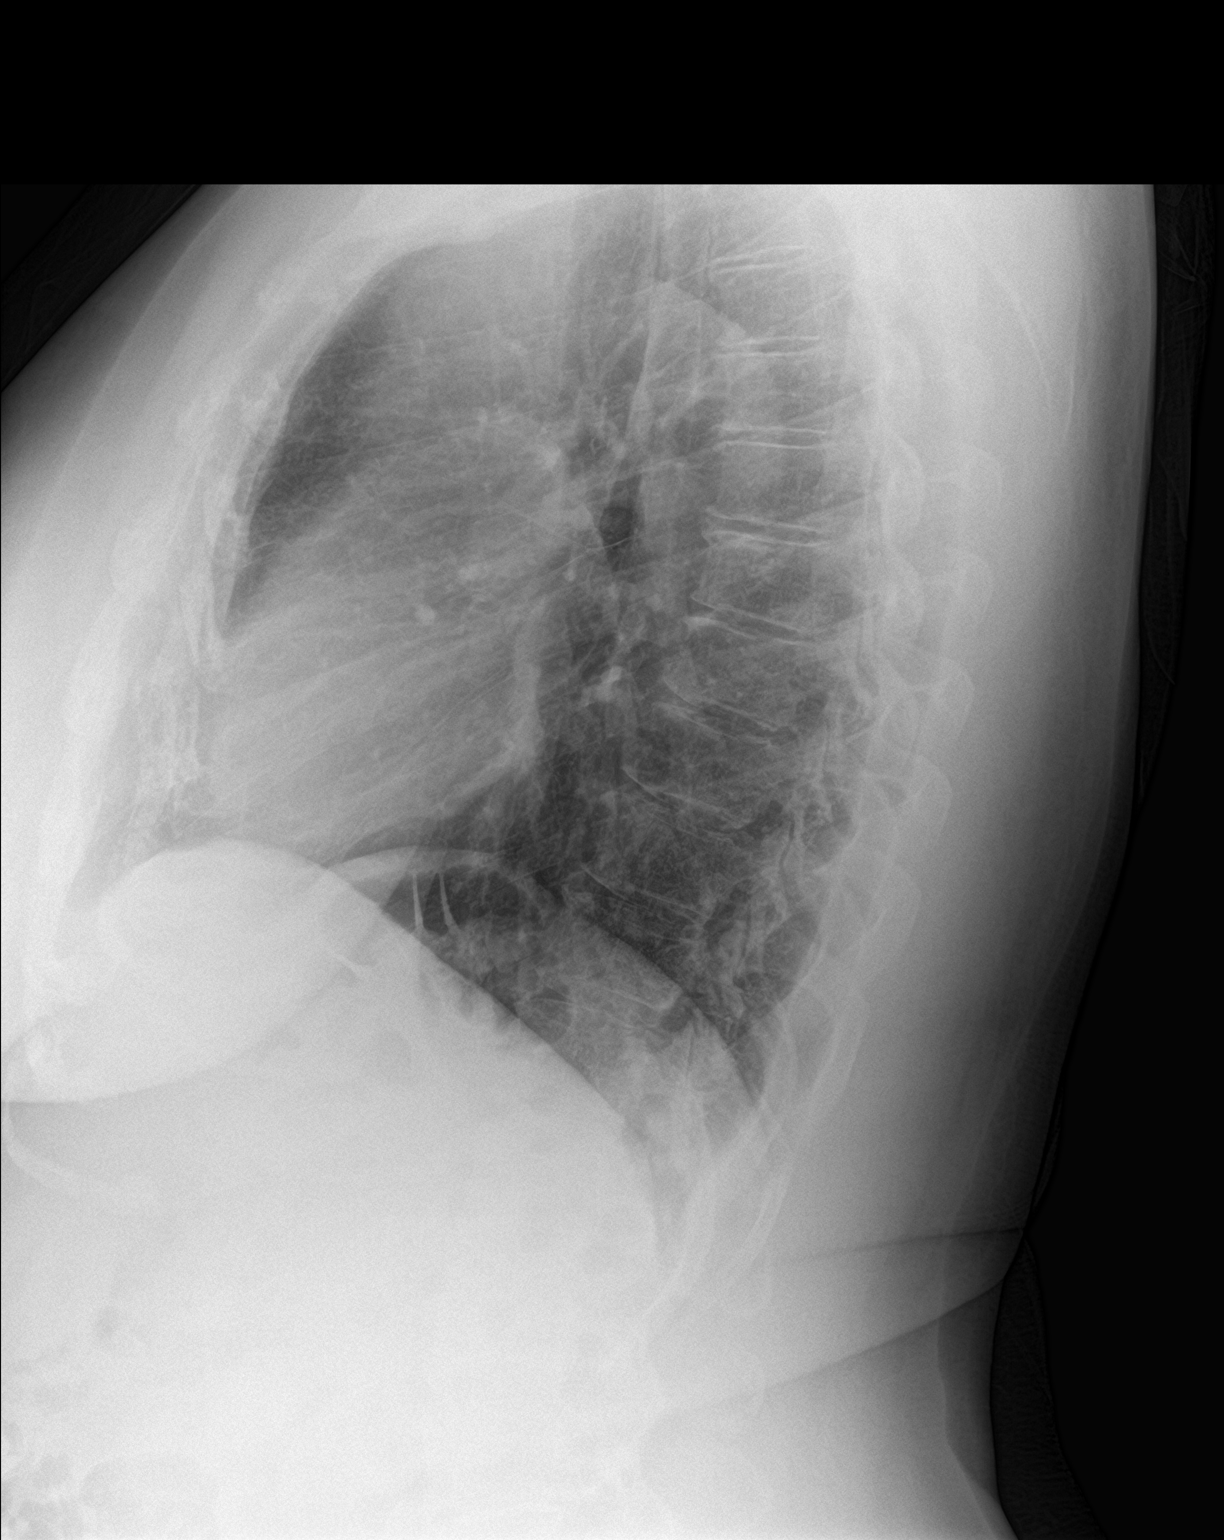

[2 of 2 positions shown; findings below may reference images not displayed]

FINDINGS: The heart size and mediastinal contours are within normal limits.
Both lungs are clear. The visualized skeletal structures are
unremarkable.
IMPRESSION: No active cardiopulmonary disease.

## 2023-01-18 ENCOUNTER — Other Ambulatory Visit (HOSPITAL_COMMUNITY): Payer: Self-pay

## 2023-01-18 DIAGNOSIS — R635 Abnormal weight gain: Secondary | ICD-10-CM | POA: Diagnosis not present

## 2023-01-18 DIAGNOSIS — Z3200 Encounter for pregnancy test, result unknown: Secondary | ICD-10-CM | POA: Diagnosis not present

## 2023-01-18 DIAGNOSIS — Z6837 Body mass index (BMI) 37.0-37.9, adult: Secondary | ICD-10-CM | POA: Diagnosis not present

## 2023-01-18 DIAGNOSIS — R7303 Prediabetes: Secondary | ICD-10-CM | POA: Diagnosis not present

## 2023-01-18 DIAGNOSIS — E559 Vitamin D deficiency, unspecified: Secondary | ICD-10-CM | POA: Diagnosis not present

## 2023-01-18 MED ORDER — PHENTERMINE HCL 37.5 MG PO TABS
37.5000 mg | ORAL_TABLET | Freq: Every day | ORAL | 0 refills | Status: DC
Start: 2023-01-18 — End: 2023-02-18
  Filled 2023-01-18: qty 30, 30d supply, fill #0

## 2023-01-18 MED ORDER — ZEPBOUND 7.5 MG/0.5ML ~~LOC~~ SOAJ
7.5000 mg | SUBCUTANEOUS | 0 refills | Status: DC
Start: 2023-01-18 — End: 2023-06-11
  Filled 2023-01-18: qty 2, 28d supply, fill #0

## 2023-01-23 ENCOUNTER — Other Ambulatory Visit (HOSPITAL_BASED_OUTPATIENT_CLINIC_OR_DEPARTMENT_OTHER): Payer: Self-pay

## 2023-01-23 ENCOUNTER — Encounter: Payer: Self-pay | Admitting: Nurse Practitioner

## 2023-01-23 ENCOUNTER — Ambulatory Visit: Payer: BC Managed Care – PPO | Admitting: Nurse Practitioner

## 2023-01-23 VITALS — BP 132/72 | HR 76 | Wt 272.0 lb

## 2023-01-23 DIAGNOSIS — N760 Acute vaginitis: Secondary | ICD-10-CM | POA: Diagnosis not present

## 2023-01-23 DIAGNOSIS — B9689 Other specified bacterial agents as the cause of diseases classified elsewhere: Secondary | ICD-10-CM | POA: Diagnosis not present

## 2023-01-23 DIAGNOSIS — N898 Other specified noninflammatory disorders of vagina: Secondary | ICD-10-CM

## 2023-01-23 DIAGNOSIS — Z113 Encounter for screening for infections with a predominantly sexual mode of transmission: Secondary | ICD-10-CM

## 2023-01-23 LAB — WET PREP FOR TRICH, YEAST, CLUE

## 2023-01-23 MED ORDER — METRONIDAZOLE 500 MG PO TABS
500.0000 mg | ORAL_TABLET | Freq: Two times a day (BID) | ORAL | 0 refills | Status: DC
Start: 2023-01-23 — End: 2023-02-20
  Filled 2023-01-23: qty 14, 7d supply, fill #0

## 2023-01-23 NOTE — Progress Notes (Signed)
   Acute Office Visit  Subjective:    Patient ID: Sierra [name], female    DOB: 21-Mar-1981, 42 y.o.   MRN: [id number]   HPI 42 y.o. presents today for clear, watery discharge. Denies itching or odor. Sexually active with new partner 1.5 weeks ago. Had trich a couple of years ago.   Patient's last menstrual period was 01/07/2023.    Review of Systems  Constitutional: Negative.   Genitourinary:  Positive for vaginal discharge. Negative for vaginal pain.       Objective:    Physical Exam Constitutional:      Appearance: Normal appearance.  Genitourinary:    General: Normal vulva.     Vagina: Normal.     Cervix: Normal.     BP 132/72   Pulse 76   Wt 272 lb (123.4 kg)   LMP 01/07/2023   SpO2 100%   BMI 37.41 kg/m  Wt Readings from Last 3 Encounters:  01/23/23 272 lb (123.4 kg)  11/22/22 279 lb (126.6 kg)  06/27/22 277 lb (125.6 kg)        Patient informed chaperone available to be present for breast and/or pelvic exam. Patient has requested no chaperone to be present. Patient has been advised what will be completed during breast and pelvic exam.   Wet prep + clue cells (+ odor)  Assessment & Plan:   Problem List Items Addressed This Visit   None Visit Diagnoses     Bacterial vaginosis    -  Primary   Relevant Medications   metroNIDAZOLE (FLAGYL) 500 MG tablet   Other Relevant Orders   WET PREP FOR TRICH, YEAST, CLUE   Vaginal discharge       Screening examination for STD (sexually transmitted disease)       Relevant Orders   SURESWAB CT/NG/T. vaginalis      Plan: Wet prep positive for BV - Flagyl 500 mg BID x 7 days. GC/CT/trich pending.      Olivia Mackie DNP, 11:33 AM 01/23/2023

## 2023-01-24 LAB — SURESWAB CT/NG/T. VAGINALIS
C. trachomatis RNA, TMA: NOT DETECTED
N. gonorrhoeae RNA, TMA: NOT DETECTED
Trichomonas vaginalis RNA: NOT DETECTED

## 2023-02-13 ENCOUNTER — Encounter: Payer: Self-pay | Admitting: Nurse Practitioner

## 2023-02-14 NOTE — Telephone Encounter (Signed)
Recommend OV. Flagyl >90% effective when taken correctly. Need to rule other causes or recurrence.

## 2023-02-14 NOTE — Telephone Encounter (Signed)
Spoke with patient. Seen in office on 01/23/23, tx for BV with Flagyl. Started medication 01/27/23. States dampness feeling she was experiencing in her panties had improved, not resolved. Denies odor, itching, pain, bleeding. Discharge does not have a color. Asking for additional Rx. Advised will update provider and f/u with recommendations, patient agreeable.   Routing to Tiffany to review and advise.

## 2023-02-14 NOTE — Telephone Encounter (Signed)
Spoke with patient, advised per Tiffany. OV scheduled for 10/9 at 0845. Patient verbalizes understanding and is agreeable.   Encounter closed.

## 2023-02-18 ENCOUNTER — Other Ambulatory Visit (HOSPITAL_BASED_OUTPATIENT_CLINIC_OR_DEPARTMENT_OTHER): Payer: Self-pay

## 2023-02-18 ENCOUNTER — Other Ambulatory Visit: Payer: Self-pay

## 2023-02-18 DIAGNOSIS — Z3200 Encounter for pregnancy test, result unknown: Secondary | ICD-10-CM | POA: Diagnosis not present

## 2023-02-18 DIAGNOSIS — E559 Vitamin D deficiency, unspecified: Secondary | ICD-10-CM | POA: Diagnosis not present

## 2023-02-18 DIAGNOSIS — R7303 Prediabetes: Secondary | ICD-10-CM | POA: Diagnosis not present

## 2023-02-18 DIAGNOSIS — Z6836 Body mass index (BMI) 36.0-36.9, adult: Secondary | ICD-10-CM | POA: Diagnosis not present

## 2023-02-18 DIAGNOSIS — R635 Abnormal weight gain: Secondary | ICD-10-CM | POA: Diagnosis not present

## 2023-02-18 MED ORDER — PHENTERMINE HCL 37.5 MG PO TABS
37.5000 mg | ORAL_TABLET | Freq: Every day | ORAL | 0 refills | Status: DC
Start: 2023-02-18 — End: 2023-03-21
  Filled 2023-02-18: qty 30, 30d supply, fill #0

## 2023-02-18 MED ORDER — ZEPBOUND 10 MG/0.5ML ~~LOC~~ SOAJ
10.0000 mg | SUBCUTANEOUS | 0 refills | Status: DC
  Filled 2023-02-18: qty 2, 28d supply, fill #0

## 2023-02-19 ENCOUNTER — Encounter: Payer: Self-pay | Admitting: Nurse Practitioner

## 2023-02-20 ENCOUNTER — Other Ambulatory Visit (HOSPITAL_BASED_OUTPATIENT_CLINIC_OR_DEPARTMENT_OTHER): Payer: Self-pay

## 2023-02-20 ENCOUNTER — Encounter: Payer: Self-pay | Admitting: Nurse Practitioner

## 2023-02-20 ENCOUNTER — Ambulatory Visit: Payer: BC Managed Care – PPO | Admitting: Nurse Practitioner

## 2023-02-20 VITALS — BP 108/72 | HR 101

## 2023-02-20 DIAGNOSIS — N898 Other specified noninflammatory disorders of vagina: Secondary | ICD-10-CM | POA: Diagnosis not present

## 2023-02-20 DIAGNOSIS — N760 Acute vaginitis: Secondary | ICD-10-CM

## 2023-02-20 LAB — WET PREP FOR TRICH, YEAST, CLUE

## 2023-02-20 MED ORDER — METRONIDAZOLE 0.75 % VA GEL
1.0000 | Freq: Every day | VAGINAL | 0 refills | Status: AC
Start: 2023-02-20 — End: 2023-02-27
  Filled 2023-02-20: qty 70, 7d supply, fill #0

## 2023-02-20 NOTE — Progress Notes (Signed)
   Acute Office Visit  Subjective:    Patient ID: Sierra [name], female    DOB: 09/25/1980, 42 y.o.   MRN: [id number]   HPI 42 y.o. presents today for vaginal discharge without itching or odor. Treated for BV 01/23/2023. Does not feel symptoms improved while taking. On GLP-1 and wondering if this affected effectiveness of medication. Also on OCPs.   Patient's last menstrual period was 01/07/2023.    Review of Systems  Constitutional: Negative.   Genitourinary:  Positive for vaginal discharge. Negative for vaginal pain.       Objective:    Physical Exam Constitutional:      Appearance: Normal appearance.  Genitourinary:    General: Normal vulva.     Vagina: Vaginal discharge present. No erythema.     Cervix: Normal.     BP 108/72   Pulse (!) 101   LMP 01/07/2023   SpO2 98%  Wt Readings from Last 3 Encounters:  01/23/23 272 lb (123.4 kg)  11/22/22 279 lb (126.6 kg)  06/27/22 277 lb (125.6 kg)        Patient informed chaperone available to be present for breast and/or pelvic exam. Patient has requested no chaperone to be present. Patient has been advised what will be completed during breast and pelvic exam.   Wet prep + clue cells (+ odor)  Assessment & Plan:   Problem List Items Addressed This Visit   None Visit Diagnoses     Bacterial vaginosis    -  Primary   Relevant Medications   metroNIDAZOLE (METROGEL) 0.75 % vaginal gel   Vaginal discharge       Relevant Orders   WET PREP FOR TRICH, YEAST, CLUE      Plan: Wet prep positive for clue cells. GLP-1 could have affected absorption of PO Flagyl. Will do Metrogel x 5 days. Also discussed GLP-1 and OCPs. Does not want to change birth control at this time. Will use back up contraception.   Return if symptoms worsen or fail to improve.    Olivia Mackie DNP, 8:59 AM 02/20/2023

## 2023-03-21 ENCOUNTER — Other Ambulatory Visit (HOSPITAL_COMMUNITY): Payer: Self-pay

## 2023-03-21 ENCOUNTER — Other Ambulatory Visit: Payer: Self-pay

## 2023-03-21 ENCOUNTER — Other Ambulatory Visit (HOSPITAL_BASED_OUTPATIENT_CLINIC_OR_DEPARTMENT_OTHER): Payer: Self-pay

## 2023-03-21 DIAGNOSIS — R7303 Prediabetes: Secondary | ICD-10-CM | POA: Diagnosis not present

## 2023-03-21 DIAGNOSIS — Z3200 Encounter for pregnancy test, result unknown: Secondary | ICD-10-CM | POA: Diagnosis not present

## 2023-03-21 DIAGNOSIS — Z6836 Body mass index (BMI) 36.0-36.9, adult: Secondary | ICD-10-CM | POA: Diagnosis not present

## 2023-03-21 DIAGNOSIS — Z79899 Other long term (current) drug therapy: Secondary | ICD-10-CM | POA: Diagnosis not present

## 2023-03-21 DIAGNOSIS — R635 Abnormal weight gain: Secondary | ICD-10-CM | POA: Diagnosis not present

## 2023-03-21 DIAGNOSIS — E559 Vitamin D deficiency, unspecified: Secondary | ICD-10-CM | POA: Diagnosis not present

## 2023-03-21 MED ORDER — ZEPBOUND 12.5 MG/0.5ML ~~LOC~~ SOAJ
12.5000 mg | SUBCUTANEOUS | 0 refills | Status: DC
  Filled 2023-03-21: qty 2, 28d supply, fill #0

## 2023-03-21 MED ORDER — PHENTERMINE HCL 37.5 MG PO TABS
37.5000 mg | ORAL_TABLET | Freq: Every day | ORAL | 0 refills | Status: DC
  Filled 2023-03-21: qty 30, 30d supply, fill #0

## 2023-04-29 ENCOUNTER — Telehealth: Payer: BC Managed Care – PPO | Admitting: Nurse Practitioner

## 2023-04-29 DIAGNOSIS — J069 Acute upper respiratory infection, unspecified: Secondary | ICD-10-CM

## 2023-04-29 MED ORDER — PREDNISONE 20 MG PO TABS
20.0000 mg | ORAL_TABLET | Freq: Two times a day (BID) | ORAL | 0 refills | Status: AC
Start: 2023-04-29 — End: 2023-05-05
  Filled 2023-04-29: qty 10, 5d supply, fill #0

## 2023-04-29 MED ORDER — IPRATROPIUM BROMIDE 0.03 % NA SOLN
2.0000 | Freq: Two times a day (BID) | NASAL | 12 refills | Status: DC
Start: 2023-04-29 — End: 2023-06-11
  Filled 2023-04-29: qty 30, 30d supply, fill #0

## 2023-04-29 MED ORDER — BENZONATATE 100 MG PO CAPS
100.0000 mg | ORAL_CAPSULE | Freq: Three times a day (TID) | ORAL | 0 refills | Status: DC | PRN
Start: 2023-04-29 — End: 2023-06-11
  Filled 2023-04-29: qty 30, 10d supply, fill #0

## 2023-04-29 NOTE — Progress Notes (Signed)
E-Visit for Cough   We are sorry that you are not feeling well.  Here is how we plan to help!  Based on your presentation I believe you most likely have A cough due to a virus.  This is called viral bronchitis and is best treated by rest, plenty of fluids and control of the cough.  You may use Ibuprofen or Tylenol as directed to help your symptoms.     In addition you may use A prescription cough medication called Tessalon Perles 100mg . You may take 1-2 capsules every 8 hours as needed for your cough.  Prednisone 20 mg twice daily for 5 days  We will also prescribe a nasal spray to help with your runny nose   Meds ordered this encounter  Medications   benzonatate (TESSALON) 100 MG capsule    Sig: Take 1 capsule (100 mg total) by mouth 3 (three) times daily as needed.    Dispense:  30 capsule    Refill:  0   predniSONE (DELTASONE) 20 MG tablet    Sig: Take 1 tablet (20 mg total) by mouth 2 (two) times daily with a meal for 5 days.    Dispense:  10 tablet    Refill:  0   ipratropium (ATROVENT) 0.03 % nasal spray    Sig: Place 2 sprays into both nostrils every 12 (twelve) hours.    Dispense:  30 mL    Refill:  12      From your responses in the eVisit questionnaire you describe inflammation in the upper respiratory tract which is causing a significant cough.  This is commonly called Bronchitis and has four common causes:   Allergies Viral Infections Acid Reflux Bacterial Infection Allergies, viruses and acid reflux are treated by controlling symptoms or eliminating the cause. An example might be a cough caused by taking certain blood pressure medications. You stop the cough by changing the medication. Another example might be a cough caused by acid reflux. Controlling the reflux helps control the cough.     HOME CARE Only take medications as instructed by your medical team. Complete the entire course of an antibiotic. Drink plenty of fluids and get plenty of rest. Avoid close  contacts especially the very young and the elderly Cover your mouth if you cough or cough into your sleeve. Always remember to wash your hands A steam or ultrasonic humidifier can help congestion.   GET HELP RIGHT AWAY IF: You develop worsening fever. You become short of breath You cough up blood. Your symptoms persist after you have completed your treatment plan MAKE SURE YOU  Understand these instructions. Will watch your condition. Will get help right away if you are not doing well or get worse.    Thank you for choosing an e-visit.  Your e-visit answers were reviewed by a board certified advanced clinical practitioner to complete your personal care plan. Depending upon the condition, your plan could have included both over the counter or prescription medications.  Please review your pharmacy choice. Make sure the pharmacy is open so you can pick up prescription now. If there is a problem, you may contact your provider through [employer] and have the prescription routed to another pharmacy.  Your safety is important to [place]. If you have drug allergies check your prescription carefully.   For the next 24 hours you can use MyChart to ask questions about today's visit, request a non-urgent call back, or ask for a work or school excuse. You will  get an email in the next two days asking about your experience. I hope that your e-visit has been valuable and will speed your recovery.   I spent approximately 5 minutes reviewing the patient's history, current symptoms and coordinating their care today.

## 2023-04-30 ENCOUNTER — Other Ambulatory Visit: Payer: Self-pay

## 2023-04-30 ENCOUNTER — Other Ambulatory Visit (HOSPITAL_BASED_OUTPATIENT_CLINIC_OR_DEPARTMENT_OTHER): Payer: Self-pay

## 2023-05-13 ENCOUNTER — Other Ambulatory Visit: Payer: Self-pay

## 2023-05-17 ENCOUNTER — Other Ambulatory Visit (HOSPITAL_BASED_OUTPATIENT_CLINIC_OR_DEPARTMENT_OTHER): Payer: Self-pay

## 2023-05-17 MED ORDER — PHENTERMINE HCL 37.5 MG PO TABS
37.5000 mg | ORAL_TABLET | Freq: Every day | ORAL | 0 refills | Status: DC
Start: 2023-05-17 — End: 2023-07-01
  Filled 2023-05-17: qty 30, 30d supply, fill #0

## 2023-05-17 MED ORDER — ZEPBOUND 15 MG/0.5ML ~~LOC~~ SOAJ
15.0000 mg | SUBCUTANEOUS | 0 refills | Status: DC
  Filled 2023-05-17: qty 2, 28d supply, fill #0

## 2023-05-22 ENCOUNTER — Other Ambulatory Visit (HOSPITAL_BASED_OUTPATIENT_CLINIC_OR_DEPARTMENT_OTHER): Payer: Self-pay

## 2023-05-28 ENCOUNTER — Encounter: Payer: Self-pay | Admitting: Family Medicine

## 2023-06-04 ENCOUNTER — Other Ambulatory Visit: Payer: Self-pay | Admitting: Obstetrics and Gynecology

## 2023-06-04 DIAGNOSIS — Z1231 Encounter for screening mammogram for malignant neoplasm of breast: Secondary | ICD-10-CM

## 2023-06-11 ENCOUNTER — Encounter: Payer: Self-pay | Admitting: Family Medicine

## 2023-06-11 ENCOUNTER — Ambulatory Visit: Payer: BC Managed Care – PPO | Admitting: Family Medicine

## 2023-06-11 VITALS — BP 133/89 | HR 102 | Temp 98.1°F | Ht 71.5 in | Wt 263.6 lb

## 2023-06-11 DIAGNOSIS — Z6836 Body mass index (BMI) 36.0-36.9, adult: Secondary | ICD-10-CM | POA: Diagnosis not present

## 2023-06-11 DIAGNOSIS — R7303 Prediabetes: Secondary | ICD-10-CM

## 2023-06-11 DIAGNOSIS — R030 Elevated blood-pressure reading, without diagnosis of hypertension: Secondary | ICD-10-CM | POA: Insufficient documentation

## 2023-06-11 NOTE — Assessment & Plan Note (Addendum)
She is following with weight loss clinic.  On Zepbound 15 mg weekly.o we will check A1c when she comes back in for physical in a few months.

## 2023-06-11 NOTE — Patient Instructions (Addendum)
It was very nice to see you today!  Your blood pressure is at goal.  Please continue to monitor and let us know if persistently 140/90 at home.  Please continue to work on diet and exercise.  Please work on cutting down on sodium.  Return if symptoms worsen or fail to improve.   Take care, Dr [name]  PLEASE NOTE:  If you had any lab tests, please let us know if you have not heard back within a few days. You may see your results on mychart before we have a chance to review them but we will give you a call once they are reviewed by [place].   If we ordered any referrals today, please let us know if you have not heard from their office within the next week.   If you had any urgent prescriptions sent in today, please check with the pharmacy within an hour of our visit to make sure the prescription was transmitted appropriately.   Please try these tips to maintain a healthy lifestyle:  Eat at least 3 REAL meals and 1-2 snacks per day.  Aim for no more than 5 hours between eating.  If you eat breakfast, please do so within one hour of getting up.   Each meal should contain half fruits/vegetables, one quarter protein, and one quarter carbs (no bigger than a computer mouse)  Cut down on sweet beverages. This includes juice, soda, and sweet tea.   Drink at least 1 glass of water with each meal and aim for at least 8 glasses per day  Exercise at least 150 minutes every week.

## 2023-06-11 NOTE — Assessment & Plan Note (Signed)
She is working with weight loss clinic.  She is on Zepbound and phentermine.  She did recently stop her phentermine due to her higher blood pressure readings.  She will follow-up with weight loss clinic next week to discuss restarting.

## 2023-06-11 NOTE — Progress Notes (Signed)
   Sierra [name] is a 43 y.o. female who presents today for an office visit.  Assessment/Plan:  Chronic Problems Addressed Today: Elevated blood pressure reading Patient's blood pressure is normal in the office today.  She has been mostly at goal at home as well.  No current symptoms.  Given normalization of blood pressure over the last few weeks and we will readings today do not think we need to start any antihypertensives.  We did discuss lifestyle modifications including reducing sodium intake and increasing cardiovascular exercise.  She will continue to monitor at home for the next several weeks and let us know if persistently elevated to 140/90 or higher.  Prediabetes She is following with weight loss clinic.  On Zepbound 15 mg weekly.o we will check A1c when she comes back in for physical in a few months.   Morbid obesity (HCC) She is working with weight loss clinic.  She is on Zepbound and phentermine.  She did recently stop her phentermine due to her higher blood pressure readings.  She will follow-up with weight loss clinic next week to discuss restarting.     Subjective:  HPI:  See assessment / plan for status of chronic conditions. She is here today for concern for elevated BP reading. She did have a health screening at work and was found to be 130s/90's. She has been checking at home the last couple of weeks and she is noticing mostly in 130/80's though has noticed a highest reading of 140/90. No symptoms.  No chest pain or shortness of breath.  No leg swelling.  No orthopnea.  No headaches or dizziness.       Objective:  Physical Exam: BP 133/89   Pulse (!) 102   Temp 98.1 F (36.7 C) (Temporal)   Ht 5' 11.5" (1.816 m)   Wt 263 lb 9.6 oz (119.6 kg)   LMP 06/05/2023   SpO2 98%   BMI 36.25 kg/m   Gen: No acute distress, resting comfortably CV: Regular rate and rhythm with no murmurs appreciated Pulm: Normal work of breathing, clear to auscultation bilaterally with no  crackles, wheezes, or rhonchi Neuro: Grossly normal, moves all extremities Psych: Normal affect and thought content      Caddie Randle M. [name], MD 06/11/2023 8:36 AM

## 2023-06-11 NOTE — Assessment & Plan Note (Signed)
Patient's blood pressure is normal in the office today.  She has been mostly at goal at home as well.  No current symptoms.  Given normalization of blood pressure over the last few weeks and we will readings today do not think we need to start any antihypertensives.  We did discuss lifestyle modifications including reducing sodium intake and increasing cardiovascular exercise.  She will continue to monitor at home for the next several weeks and let us know if persistently elevated to 140/90 or higher.

## 2023-07-01 ENCOUNTER — Other Ambulatory Visit (HOSPITAL_BASED_OUTPATIENT_CLINIC_OR_DEPARTMENT_OTHER): Payer: Self-pay

## 2023-07-01 MED ORDER — ZEPBOUND 15 MG/0.5ML ~~LOC~~ SOAJ
15.0000 mg | SUBCUTANEOUS | 0 refills | Status: DC
  Filled 2023-07-01: qty 2, 28d supply, fill #0

## 2023-07-01 MED ORDER — PHENTERMINE HCL 37.5 MG PO TABS
37.5000 mg | ORAL_TABLET | Freq: Every day | ORAL | 0 refills | Status: DC
Start: 2023-07-01 — End: 2023-07-10
  Filled 2023-07-01: qty 30, 30d supply, fill #0

## 2023-07-02 ENCOUNTER — Ambulatory Visit
Admission: RE | Admit: 2023-07-02 | Discharge: 2023-07-02 | Disposition: A | Discharge status: Home / Self Care | Payer: BC Managed Care – PPO | Source: Ambulatory Visit | Attending: Obstetrics and Gynecology

## 2023-07-02 DIAGNOSIS — Z1231 Encounter for screening mammogram for malignant neoplasm of breast: Secondary | ICD-10-CM

## 2023-07-04 ENCOUNTER — Encounter: Payer: Self-pay | Admitting: Obstetrics and Gynecology

## 2023-07-10 ENCOUNTER — Encounter: Payer: Self-pay | Admitting: Obstetrics and Gynecology

## 2023-07-10 ENCOUNTER — Ambulatory Visit (INDEPENDENT_AMBULATORY_CARE_PROVIDER_SITE_OTHER): Payer: BC Managed Care – PPO | Admitting: Obstetrics and Gynecology

## 2023-07-10 VITALS — BP 124/78 | HR 100 | Temp 98.4°F | Ht 71.0 in | Wt 264.0 lb

## 2023-07-10 DIAGNOSIS — Z01419 Encounter for gynecological examination (general) (routine) without abnormal findings: Secondary | ICD-10-CM | POA: Insufficient documentation

## 2023-07-10 DIAGNOSIS — Z3041 Encounter for surveillance of contraceptive pills: Secondary | ICD-10-CM

## 2023-07-10 DIAGNOSIS — B9689 Other specified bacterial agents as the cause of diseases classified elsewhere: Secondary | ICD-10-CM

## 2023-07-10 DIAGNOSIS — N898 Other specified noninflammatory disorders of vagina: Secondary | ICD-10-CM

## 2023-07-10 DIAGNOSIS — B372 Candidiasis of skin and nail: Secondary | ICD-10-CM | POA: Diagnosis not present

## 2023-07-10 DIAGNOSIS — N760 Acute vaginitis: Secondary | ICD-10-CM

## 2023-07-10 DIAGNOSIS — Z1331 Encounter for screening for depression: Secondary | ICD-10-CM

## 2023-07-10 LAB — WET PREP FOR TRICH, YEAST, CLUE

## 2023-07-10 MED ORDER — METRONIDAZOLE 500 MG PO TABS: 500.0000 mg | ORAL_TABLET | Freq: Two times a day (BID) | ORAL | 0 refills | Status: AC

## 2023-07-10 MED ORDER — NYSTATIN 100000 UNIT/GM EX POWD: 1.0000 | Freq: Every day | CUTANEOUS | 1 refills | Status: AC | PRN

## 2023-07-10 MED ORDER — NORETHIN ACE-ETH ESTRAD-FE 1-20 MG-MCG PO TABS
1.0000 | ORAL_TABLET | Freq: Every day | ORAL | 4 refills | Status: AC
Start: 2023-07-10 — End: ?

## 2023-07-10 NOTE — Patient Instructions (Signed)

## 2023-07-10 NOTE — Addendum Note (Signed)
 Addended by: Rosalyn Gess on: 07/10/2023 05:56 PM   Modules accepted: Orders

## 2023-07-10 NOTE — Progress Notes (Addendum)
 43 y.o. G18P0050 female on COC here for annual exam. Single. She works in [employer] in [employer].  14 pound weight loss since last year  Patient's last menstrual period was 07/04/2023 (approximate). Period Duration (Days): 3-4 Menstrual Flow: Light Menstrual Control: Panty liner Dysmenorrhea: None  New partner over the past year with BV diagnosed twice. Pt would like to be checked for BV, some discharge at times.  Abnormal bleeding: none Pelvic discharge or pain: none Breast mass, nipple discharge or skin changes : none Birth control: COC Last PAP:     Component Value Date/Time   DIAGPAP  06/27/2022 0832    - Negative for Intraepithelial Lesions or Malignancy (NILM)   DIAGPAP - Benign reactive/reparative changes 06/27/2022 0832   DIAGPAP  01/28/2018 0000    NEGATIVE FOR INTRAEPITHELIAL LESIONS OR MALIGNANCY. BENIGN REACTIVE/REPARATIVE CHANGES.   HPVHIGH Negative 06/27/2022 0832   ADEQPAP  06/27/2022 0832    Satisfactory for evaluation; transformation zone component PRESENT.   ADEQPAP  01/28/2018 0000    Satisfactory for evaluation  endocervical/transformation zone component PRESENT.   Last mammogram: 07/02/2023 BI-RADS 1, density B Last colonoscopy: Never Sexually active: Yes, not since Sept 2024 Exercising: No Smoker: no Gardasil: Complete  GYN HISTORY: No significant hx  OB History  Gravida Para Term Preterm AB Living  5 0 0 0 5 0  SAB IAB Ectopic Multiple Live Births  1 4 0 0 0    # Outcome Date GA Lbr Len/2nd Weight Sex Type Anes PTL Lv  5 SAB 11/15/13             Birth Comments: System Generated. Please review and update pregnancy details.  4 IAB 2012          3 IAB 2011          2 IAB           1 IAB             Past Medical History:  Diagnosis Date   Family history of diabetes mellitus (DM)    History of vitamin D deficiency    Prediabetes 03/09/2019   Prediabetes    Vitamin D deficiency 03/09/2019    History reviewed. No pertinent surgical  history.  Current Outpatient Medications on File Prior to Visit  Medication Sig Dispense Refill   tirzepatide (ZEPBOUND) 15 MG/0.5ML Pen Inject 15 mg into the skin once a week. 2 mL 0   No current facility-administered medications on file prior to visit.    Social History   Socioeconomic History   Marital status: Single    Spouse name: Not on file   Number of children: 0   Years of education: Not on file   Highest education level: Some college, no degree  Occupational History    Employer: [employer]  Tobacco Use   Smoking status: Never   Smokeless tobacco: Never  Vaping Use   Vaping status: Never Used  Substance and Sexual Activity   Alcohol use: Yes    Alcohol/week: 3.0 standard drinks of alcohol    Types: 3 Glasses of wine per week    Comment: wine and occ mixed drink   Drug use: No   Sexual activity: Yes    Partners: Male    Birth control/protection: OCP  Other Topics Concern   Not on file  Social History Narrative   ** Merged History Encounter **       Social Drivers of Corporate investment banker Strain:  Low Risk  (06/07/2023)   Overall Financial Resource Strain (CARDIA)    Difficulty of Paying Living Expenses: Not hard at all  Food Insecurity: No Food Insecurity (06/07/2023)   Hunger Vital Sign    Worried About Running Out of Food in the Last Year: Never true    Ran Out of Food in the Last Year: Never true  Transportation Needs: No Transportation Needs (06/07/2023)   PRAPARE - Administrator, Civil Service (Medical): No    Lack of Transportation (Non-Medical): No  Physical Activity: Unknown (06/07/2023)   Exercise Vital Sign    Days of Exercise per Week: 0 days    Minutes of Exercise per Session: Not on file  Stress: No Stress Concern Present (06/07/2023)   [employer] of Occupational Health - Occupational Stress Questionnaire    Feeling of Stress : Not at all  Social Connections: Unknown (06/07/2023)   Social Connection and Isolation  Panel [NHANES]    Frequency of Communication with Friends and Family: More than three times a week    Frequency of Social Gatherings with Friends and Family: Twice a week    Attends Religious Services: Patient declined    Database administrator or Organizations: No    Attends Engineer, structural: Not on file    Marital Status: Never married  Catering manager Violence: Not on file    Family History  Problem Relation Age of Onset   Hypertension Mother    Diabetes Father        type 2   Cancer Maternal Grandmother 22       related to smoking, unsure type ? colon   Breast cancer Neg Hx     No Known Allergies    PE Today's Vitals   07/10/23 1342  BP: 124/78  Pulse: 100  Temp: 98.4 F (36.9 C)  TempSrc: Oral  SpO2: 97%  Weight: 264 lb (119.7 kg)  Height: 5\' 11"  (1.803 m)   Body mass index is 36.82 kg/m.  Physical Exam Vitals reviewed. Exam conducted with a chaperone present.  Constitutional:      General: She is not in acute distress.    Appearance: Normal appearance.  HENT:     Head: Normocephalic and atraumatic.     Nose: Nose normal.  Eyes:     Extraocular Movements: Extraocular movements intact.     Conjunctiva/sclera: Conjunctivae normal.  Neck:     Thyroid: No thyroid mass, thyromegaly or thyroid tenderness.  Pulmonary:     Effort: Pulmonary effort is normal.  Chest:     Chest wall: No mass or tenderness.  Breasts:    Right: Normal. No swelling, mass, nipple discharge, skin change or tenderness.     Left: Normal. No swelling, mass, nipple discharge, skin change or tenderness.  Abdominal:     General: There is no distension.     Palpations: Abdomen is soft.     Tenderness: There is no abdominal tenderness.  Genitourinary:    General: Normal vulva.     Exam position: Lithotomy position.     Urethra: No prolapse.     Vagina: Normal. No vaginal discharge or bleeding.     Cervix: Normal. No lesion.     Uterus: Normal. Not enlarged and not  tender.      Adnexa: Right adnexa normal and left adnexa normal.     Comments: Yeast-like rash of crural folds Musculoskeletal:        General: Normal range of motion.  Cervical back: Normal range of motion.  Lymphadenopathy:     Upper Body:     Right upper body: No axillary adenopathy.     Left upper body: No axillary adenopathy.     Lower Body: No right inguinal adenopathy. No left inguinal adenopathy.  Skin:    General: Skin is warm and dry.  Neurological:     General: No focal deficit present.     Mental Status: She is alert.  Psychiatric:        Mood and Affect: Mood normal.        Behavior: Behavior normal.      Assessment and Plan:        Well woman exam with routine gynecological exam Assessment & Plan: Cervical cancer screening performed according to ASCCP guidelines. Encouraged annual mammogram screening Labs and immunizations with her primary Encouraged safe sexual practices as indicated Encouraged healthy lifestyle practices with diet and exercise For patients under 50yo, I recommend 1000mg  calcium daily and 600IU of vitamin D daily.    Vaginal discharge -     WET PREP FOR TRICH, YEAST, CLUE  Candidal intertrigo -     Nystatin; Apply 1 Application topically daily as needed for up to 14 days.  Dispense: 60 g; Refill: 1  Oral contraceptive pill surveillance -     Norethin Ace-Eth Estrad-FE; Take 1 tablet by mouth daily.  Dispense: 84 tablet; Refill: 4  BV (bacterial vaginosis) -     metroNIDAZOLE; Take 1 tablet (500 mg total) by mouth 2 (two) times daily for 7 days.  Dispense: 14 tablet; Refill: 0    Rosalyn Gess, MD

## 2023-07-10 NOTE — Assessment & Plan Note (Signed)
 Cervical cancer screening performed according to ASCCP guidelines. Encouraged annual mammogram screening Labs and immunizations with her primary Encouraged safe sexual practices as indicated Encouraged healthy lifestyle practices with diet and exercise For patients under 45yo, I recommend 1000mg  calcium daily and 600IU of vitamin D daily.

## 2023-07-29 ENCOUNTER — Other Ambulatory Visit (HOSPITAL_BASED_OUTPATIENT_CLINIC_OR_DEPARTMENT_OTHER): Payer: Self-pay

## 2023-07-29 MED ORDER — ZEPBOUND 15 MG/0.5ML ~~LOC~~ SOAJ
15.0000 mg | SUBCUTANEOUS | 0 refills | Status: DC
  Filled 2023-07-29: qty 2, 28d supply, fill #0

## 2023-08-02 ENCOUNTER — Other Ambulatory Visit (HOSPITAL_COMMUNITY): Payer: Self-pay

## 2023-08-15 ENCOUNTER — Other Ambulatory Visit (HOSPITAL_BASED_OUTPATIENT_CLINIC_OR_DEPARTMENT_OTHER): Payer: Self-pay

## 2023-08-15 ENCOUNTER — Telehealth: Admitting: Physician Assistant

## 2023-08-15 DIAGNOSIS — J0110 Acute frontal sinusitis, unspecified: Secondary | ICD-10-CM | POA: Diagnosis not present

## 2023-08-15 MED ORDER — FLUTICASONE PROPIONATE 50 MCG/ACT NA SUSP
2.0000 | Freq: Every day | NASAL | 0 refills | Status: DC
  Filled 2023-08-15: qty 16, 30d supply, fill #0

## 2023-08-15 MED ORDER — AMOXICILLIN-POT CLAVULANATE 875-125 MG PO TABS
1.0000 | ORAL_TABLET | Freq: Two times a day (BID) | ORAL | 0 refills | Status: DC
  Filled 2023-08-15: qty 14, 7d supply, fill #0

## 2023-08-15 NOTE — Progress Notes (Signed)
 Message sent to patient requesting further input regarding current symptoms. Awaiting patient response.

## 2023-08-15 NOTE — Progress Notes (Signed)
 E-Visit for Sinus Problems  We are sorry that you are not feeling well.  Here is how we plan to help!  Based on what you have shared with me it looks like you have sinusitis.  Sinusitis is inflammation and infection in the sinus cavities of the head.  Based on your presentation I believe you most likely have Acute Bacterial Sinusitis.  This is an infection caused by bacteria and is treated with antibiotics. I have prescribed Augmentin 875mg /125mg  one tablet twice daily with food, for 7 days. I have sent in a nasal steroid spray, fluticasone, to use as directed. You may use an oral decongestant such as Mucinex D or if you have glaucoma or high blood pressure use plain Mucinex. Saline nasal spray help and can safely be used as often as needed for congestion.  If you develop worsening sinus pain, fever or notice severe headache and vision changes, or if symptoms are not better after completion of antibiotic, please schedule an appointment with a health care provider.    Sinus infections are not as easily transmitted as other respiratory infection, however we still recommend that you avoid close contact with loved ones, especially the very young and elderly.  Remember to wash your hands thoroughly throughout the day as this is the number one way to prevent the spread of infection!  Home Care: Only take medications as instructed by your medical team. Complete the entire course of an antibiotic. Do not take these medications with alcohol. A steam or ultrasonic humidifier can help congestion.  You can place a towel over your head and breathe in the steam from hot water coming from a faucet. Avoid close contacts especially the very young and the elderly. Cover your mouth when you cough or sneeze. Always remember to wash your hands.  Get Help Right Away If: You develop worsening fever or sinus pain. You develop a severe head ache or visual changes. Your symptoms persist after you have completed your  treatment plan.  Make sure you Understand these instructions. Will watch your condition. Will get help right away if you are not doing well or get worse.  Thank you for choosing an e-visit.  Your e-visit answers were reviewed by a board certified advanced clinical practitioner to complete your personal care plan. Depending upon the condition, your plan could have included both over the counter or prescription medications.  Please review your pharmacy choice. Make sure the pharmacy is open so you can pick up prescription now. If there is a problem, you may contact your provider through [employer] and have the prescription routed to another pharmacy.  Your safety is important to [place]. If you have drug allergies check your prescription carefully.   For the next 24 hours you can use MyChart to ask questions about today's visit, request a non-urgent call back, or ask for a work or school excuse. You will get an email in the next two days asking about your experience. I hope that your e-visit has been valuable and will speed your recovery.

## 2023-08-15 NOTE — Progress Notes (Signed)
 I have spent 5 minutes in review of e-visit questionnaire, review and updating patient chart, medical decision making and response to patient.   Piedad Climes, PA-C

## 2023-08-29 ENCOUNTER — Other Ambulatory Visit (HOSPITAL_BASED_OUTPATIENT_CLINIC_OR_DEPARTMENT_OTHER): Payer: Self-pay

## 2023-08-29 ENCOUNTER — Encounter (HOSPITAL_BASED_OUTPATIENT_CLINIC_OR_DEPARTMENT_OTHER): Payer: Self-pay

## 2023-08-29 MED ORDER — ZEPBOUND 15 MG/0.5ML ~~LOC~~ SOAJ
15.0000 mg | SUBCUTANEOUS | 0 refills | Status: DC
  Filled 2023-08-29: qty 2, 28d supply, fill #0

## 2023-08-30 ENCOUNTER — Other Ambulatory Visit: Payer: Self-pay

## 2023-09-11 ENCOUNTER — Other Ambulatory Visit (HOSPITAL_BASED_OUTPATIENT_CLINIC_OR_DEPARTMENT_OTHER): Payer: Self-pay

## 2023-10-16 ENCOUNTER — Other Ambulatory Visit (HOSPITAL_BASED_OUTPATIENT_CLINIC_OR_DEPARTMENT_OTHER): Payer: Self-pay

## 2023-10-16 ENCOUNTER — Other Ambulatory Visit: Payer: Self-pay

## 2023-10-16 MED ORDER — ZEPBOUND 15 MG/0.5ML ~~LOC~~ SOAJ
15.0000 mg | SUBCUTANEOUS | 0 refills | Status: DC
  Filled 2023-10-16: qty 2, 28d supply, fill #0

## 2023-10-16 MED ORDER — PHENTERMINE HCL 37.5 MG PO TABS
37.5000 mg | ORAL_TABLET | Freq: Every day | ORAL | 0 refills | Status: DC
  Filled 2023-10-16: qty 30, 30d supply, fill #0

## 2023-10-16 MED ORDER — ERGOCALCIFEROL 1.25 MG (50000 UT) PO CAPS
1.0000 | ORAL_CAPSULE | ORAL | 5 refills | Status: AC
  Filled 2023-10-16: qty 4, 28d supply, fill #0
  Filled 2023-11-14: qty 4, 28d supply, fill #1
  Filled 2023-12-24: qty 4, 28d supply, fill #2
  Filled 2024-01-15: qty 4, 28d supply, fill #3

## 2023-11-14 ENCOUNTER — Other Ambulatory Visit (HOSPITAL_BASED_OUTPATIENT_CLINIC_OR_DEPARTMENT_OTHER): Payer: Self-pay

## 2023-11-14 ENCOUNTER — Other Ambulatory Visit: Payer: Self-pay

## 2023-11-14 MED ORDER — ZEPBOUND 15 MG/0.5ML ~~LOC~~ SOAJ
15.0000 mg | SUBCUTANEOUS | 0 refills | Status: DC
  Filled 2023-11-14: qty 2, 28d supply, fill #0

## 2023-11-14 MED ORDER — ZEPBOUND 15 MG/0.5ML ~~LOC~~ SOAJ
15.0000 mg | SUBCUTANEOUS | 0 refills | Status: AC
Start: 2023-11-14 — End: ?

## 2023-11-14 MED ORDER — PHENTERMINE HCL 37.5 MG PO TABS
37.5000 mg | ORAL_TABLET | Freq: Every day | ORAL | 0 refills | Status: DC
  Filled 2023-11-14: qty 30, 30d supply, fill #0

## 2023-11-20 ENCOUNTER — Other Ambulatory Visit (HOSPITAL_BASED_OUTPATIENT_CLINIC_OR_DEPARTMENT_OTHER): Payer: Self-pay

## 2023-11-22 ENCOUNTER — Other Ambulatory Visit (HOSPITAL_BASED_OUTPATIENT_CLINIC_OR_DEPARTMENT_OTHER): Payer: Self-pay

## 2023-11-25 ENCOUNTER — Encounter: Payer: BC Managed Care – PPO | Admitting: Family Medicine

## 2023-11-30 ENCOUNTER — Other Ambulatory Visit (HOSPITAL_BASED_OUTPATIENT_CLINIC_OR_DEPARTMENT_OTHER): Payer: Self-pay

## 2023-12-06 ENCOUNTER — Encounter: Payer: Self-pay | Admitting: Nurse Practitioner

## 2023-12-06 ENCOUNTER — Ambulatory Visit: Admitting: Nurse Practitioner

## 2023-12-06 VITALS — BP 120/80 | HR 92 | Ht 71.0 in | Wt 266.4 lb

## 2023-12-06 DIAGNOSIS — N760 Acute vaginitis: Secondary | ICD-10-CM | POA: Diagnosis not present

## 2023-12-06 LAB — WET PREP FOR TRICH, YEAST, CLUE

## 2023-12-06 MED ORDER — METRONIDAZOLE 0.75 % VA GEL: 1.0000 | VAGINAL | 0 refills | Status: AC

## 2023-12-06 MED ORDER — CLINDAMYCIN PHOSPHATE 2 % VA CREA
1.0000 | TOPICAL_CREAM | Freq: Every day | VAGINAL | 0 refills | Status: AC
Start: 2023-12-06 — End: 2023-12-13

## 2023-12-06 NOTE — Progress Notes (Addendum)
   Acute Office Visit  Subjective:    Patient ID: Sierra [name], female    DOB: 05/03/1981, 43 y.o.   MRN: [id number]   HPI 43 y.o. presents today for vaginal discharge and odor since August 2024. Treated for BV 06/2023 (PO Flagyl ), 02/2023 (Metrogel ), 01/2023 (PO Flagyl ). Feels symptoms never improve with treatment. Was not sexually active from September until this May. Using dove bar soap. Tried oral probiotics.   Patient's last menstrual period was 11/07/2023 (approximate). Period Duration (Days): 2 Period Pattern: (!) Irregular Menstrual Flow: Light Menstrual Control: Panty liner Dysmenorrhea: None  Review of Systems  Constitutional: Negative.   Genitourinary:  Positive for vaginal discharge.       Vaginal odor       Objective:    Physical Exam Constitutional:      Appearance: Normal appearance.  Genitourinary:    General: Normal vulva.     Vagina: Vaginal discharge present. No erythema.     Cervix: Normal.     BP 120/80 (BP Location: Right Arm, Patient Position: Sitting)   Pulse 92   Ht 5' 11 (1.803 m)   Wt 266 lb 6.4 oz (120.8 kg)   LMP 11/07/2023 (Approximate)   SpO2 97%   BMI 37.16 kg/m  Wt Readings from Last 3 Encounters:  12/06/23 266 lb 6.4 oz (120.8 kg)  07/10/23 264 lb (119.7 kg)  06/11/23 263 lb 9.6 oz (119.6 kg)        Zada Louder, CMA present as chaperone.   Wet prep + clue cells (+ odor)  Assessment & Plan:   Problem List Items Addressed This Visit   None Visit Diagnoses       Bacterial vaginosis    -  Primary   Relevant Medications   clindamycin (CLEOCIN) 2 % vaginal cream   metroNIDAZOLE  (METROGEL ) 0.75 % vaginal gel (Start on 12/09/2023)   Other Relevant Orders   WET PREP FOR TRICH, YEAST, CLUE     Recurrent vaginitis       Relevant Medications   metroNIDAZOLE  (METROGEL ) 0.75 % vaginal gel (Start on 12/09/2023)   Other Relevant Orders   SureSwab Mycoplasma/Ureaplasma, PCR      Plan: Wet prep + clue cells - Clindamycin gel  nightly x 7 days. Beginning one week after completing this start Metrogel  twice a week x 16 weeks. Recommend women's probiotic and using water only on vulva. Ureaplasma/mycoplasma pending.   Return if symptoms worsen or fail to improve.    Annabella DELENA Shutter DNP, 9:02 AM 12/06/2023

## 2023-12-12 LAB — SURESWAB MYCOPLASMA/UREAPLASMA, PCR
M. hominis DNA: NOT DETECTED
MYCOPLASMA GENITALIUM, rRNA,TMA: NOT DETECTED
U. parvum DNA: DETECTED — AB
U. urealyticum DNA: NOT DETECTED

## 2023-12-13 ENCOUNTER — Other Ambulatory Visit (HOSPITAL_BASED_OUTPATIENT_CLINIC_OR_DEPARTMENT_OTHER): Payer: Self-pay

## 2023-12-13 ENCOUNTER — Ambulatory Visit: Payer: Self-pay | Admitting: Nurse Practitioner

## 2023-12-13 ENCOUNTER — Other Ambulatory Visit: Payer: Self-pay | Admitting: Nurse Practitioner

## 2023-12-13 DIAGNOSIS — A493 Mycoplasma infection, unspecified site: Secondary | ICD-10-CM

## 2023-12-13 MED ORDER — DOXYCYCLINE MONOHYDRATE 100 MG PO CAPS: 100.0000 mg | ORAL_CAPSULE | Freq: Two times a day (BID) | ORAL | 0 refills | Status: AC

## 2023-12-13 MED ORDER — PHENTERMINE HCL 37.5 MG PO TABS
37.5000 mg | ORAL_TABLET | Freq: Every day | ORAL | 0 refills | Status: DC
  Filled 2023-12-13: qty 30, 30d supply, fill #0

## 2023-12-13 MED ORDER — ZEPBOUND 15 MG/0.5ML ~~LOC~~ SOAJ
15.0000 mg | SUBCUTANEOUS | 0 refills | Status: DC
  Filled 2023-12-13: qty 2, 28d supply, fill #0

## 2023-12-13 MED ORDER — ZEPBOUND 15 MG/0.5ML ~~LOC~~ SOAJ
15.0000 mg | SUBCUTANEOUS | 0 refills | Status: AC
Start: 2023-12-13 — End: ?
  Filled 2023-12-13: qty 2, 28d supply, fill #0

## 2023-12-17 ENCOUNTER — Other Ambulatory Visit (HOSPITAL_BASED_OUTPATIENT_CLINIC_OR_DEPARTMENT_OTHER): Payer: Self-pay

## 2023-12-20 ENCOUNTER — Other Ambulatory Visit (HOSPITAL_BASED_OUTPATIENT_CLINIC_OR_DEPARTMENT_OTHER): Payer: Self-pay

## 2023-12-24 ENCOUNTER — Other Ambulatory Visit (HOSPITAL_BASED_OUTPATIENT_CLINIC_OR_DEPARTMENT_OTHER): Payer: Self-pay

## 2023-12-26 ENCOUNTER — Other Ambulatory Visit (HOSPITAL_BASED_OUTPATIENT_CLINIC_OR_DEPARTMENT_OTHER): Payer: Self-pay

## 2023-12-31 ENCOUNTER — Ambulatory Visit: Payer: Self-pay | Admitting: Family Medicine

## 2023-12-31 ENCOUNTER — Encounter: Payer: Self-pay | Admitting: Family Medicine

## 2023-12-31 ENCOUNTER — Ambulatory Visit (INDEPENDENT_AMBULATORY_CARE_PROVIDER_SITE_OTHER): Admitting: Family Medicine

## 2023-12-31 VITALS — BP 120/80 | HR 95 | Temp 97.5°F | Ht 71.0 in | Wt 269.0 lb

## 2023-12-31 DIAGNOSIS — Z13220 Encounter for screening for lipoid disorders: Secondary | ICD-10-CM

## 2023-12-31 DIAGNOSIS — Z0001 Encounter for general adult medical examination with abnormal findings: Secondary | ICD-10-CM | POA: Diagnosis not present

## 2023-12-31 DIAGNOSIS — E559 Vitamin D deficiency, unspecified: Secondary | ICD-10-CM

## 2023-12-31 DIAGNOSIS — R7303 Prediabetes: Secondary | ICD-10-CM

## 2023-12-31 DIAGNOSIS — Z1211 Encounter for screening for malignant neoplasm of colon: Secondary | ICD-10-CM

## 2023-12-31 LAB — CBC
HCT: 41.6 % (ref 36.0–46.0)
Hemoglobin: 13 g/dL (ref 12.0–15.0)
MCHC: 31.3 g/dL (ref 30.0–36.0)
MCV: 75.7 fl — ABNORMAL LOW (ref 78.0–100.0)
Platelets: 205 K/uL (ref 150.0–400.0)
RBC: 5.49 Mil/uL — ABNORMAL HIGH (ref 3.87–5.11)
RDW: 14.1 % (ref 11.5–15.5)
WBC: 5.8 K/uL (ref 4.0–10.5)

## 2023-12-31 LAB — COMPREHENSIVE METABOLIC PANEL WITH GFR
ALT: 31 U/L (ref 0–35)
AST: 17 U/L (ref 0–37)
Albumin: 4.3 g/dL (ref 3.5–5.2)
Alkaline Phosphatase: 62 U/L (ref 39–117)
BUN: 16 mg/dL (ref 6–23)
CO2: 23 meq/L (ref 19–32)
Calcium: 9 mg/dL (ref 8.4–10.5)
Chloride: 103 meq/L (ref 96–112)
Creatinine, Ser: 0.73 mg/dL (ref 0.40–1.20)
GFR: 99.3 mL/min (ref >60.00)
Glucose, Bld: 93 mg/dL (ref 70–99)
Potassium: 3.9 meq/L (ref 3.5–5.1)
Sodium: 137 meq/L (ref 135–145)
Total Bilirubin: 0.4 mg/dL (ref 0.2–1.2)
Total Protein: 7.1 g/dL (ref 6.0–8.3)

## 2023-12-31 LAB — LIPID PANEL
Cholesterol: 129 mg/dL (ref 0–200)
HDL: 53.7 mg/dL (ref >39.00)
LDL Cholesterol: 49 mg/dL (ref 0–99)
NonHDL: 75.05
Total CHOL/HDL Ratio: 2
Triglycerides: 129 mg/dL (ref 0.0–149.0)
VLDL: 25.8 mg/dL (ref 0.0–40.0)

## 2023-12-31 LAB — TSH: TSH: 1.73 u[IU]/mL (ref 0.35–5.50)

## 2023-12-31 LAB — HEMOGLOBIN A1C: Hgb A1c MFr Bld: 6.1 % (ref 4.6–6.5)

## 2023-12-31 LAB — VITAMIN D 25 HYDROXY (VIT D DEFICIENCY, FRACTURES): VITD: 35.23 ng/mL (ref 30.00–100.00)

## 2023-12-31 MED ORDER — BENZONATATE 200 MG PO CAPS: 200.0000 mg | ORAL_CAPSULE | Freq: Two times a day (BID) | ORAL | 0 refills | Status: DC | PRN

## 2023-12-31 NOTE — Assessment & Plan Note (Signed)
 Check.  Discussed lifestyle modifications.

## 2023-12-31 NOTE — Patient Instructions (Signed)
 It was very nice to see you today!  VISIT SUMMARY: You came in for your annual physical exam. We discussed your recent cough, weight management, and prediabetes. We also reviewed your vision changes and preventative care measures.  YOUR PLAN: ACUTE UPPER RESPIRATORY INFECTION WITH COUGH: You have a cough with mucus that is likely viral.  -Continue monitoring your symptoms. The cough is a protective reflex and may linger for a while. -I have prescribed Tessalon  to help with your cough.  MORBID OBESITY DUE TO EXCESS CALORIES: You are facing challenges with weight management and are currently on Zepbound  15 mg weekly. There are concerns about insurance coverage for this medication. -Continue taking Zepbound  15 mg weekly as long as it is covered by your insurance. -Consider dietary changes, especially reducing starchy carbohydrates, to help with weight management. -Explore less costly alternatives through [employer] if insurance stops covering Zepbound .  PREDIABETES: Your blood sugar levels are borderline, indicating prediabetes. -Monitor your blood sugar levels regularly. -Make lifestyle changes, including improving your diet and increasing exercise, to prevent diabetes.  GENERAL HEALTH MAINTENANCE: We focused on preventative care during this visit. -A colonoscopy is recommended now that you are 43 years old. I will refer you to a gastroenterologist. -Your pap smear, mammograms, and vaccinations are up to date. -We will order blood work today  Return in about 1 year (around 12/30/2024) for Annual Physical.   Take care, Dr Kennyth  PLEASE NOTE:  If you had any lab tests, please let us  know if you have not heard back within a few days. You may see your results on mychart before we have a chance to review them but we will give you a call once they are reviewed by us .   If we ordered any referrals today, please let us  know if you have not heard from their office within the next  week.   If you had any urgent prescriptions sent in today, please check with the pharmacy within an hour of our visit to make sure the prescription was transmitted appropriately.   Please try these tips to maintain a healthy lifestyle:  Eat at least 3 REAL meals and 1-2 snacks per day.  Aim for no more than 5 hours between eating.  If you eat breakfast, please do so within one hour of getting up.   Each meal should contain half fruits/vegetables, one quarter protein, and one quarter carbs (no bigger than a computer mouse)  Cut down on sweet beverages. This includes juice, soda, and sweet tea.   Drink at least 1 glass of water with each meal and aim for at least 8 glasses per day  Exercise at least 150 minutes every week.    Preventive Care 64-35 Years Old, Female Preventive care refers to lifestyle choices and visits with your health care provider that can promote health and wellness. Preventive care visits are also called wellness exams. What can I expect for my preventive care visit? Counseling Your health care provider may ask you questions about your: Medical history, including: Past medical problems. Family medical history. Pregnancy history. Current health, including: Menstrual cycle. Method of birth control. Emotional well-being. Home life and relationship well-being. Sexual activity and sexual health. Lifestyle, including: Alcohol, nicotine or tobacco, and drug use. Access to firearms. Diet, exercise, and sleep habits. Work and work Astronomer. Sunscreen use. Safety issues such as seatbelt and bike helmet use. Physical exam Your health care provider will check your: Height and weight. These may be used to  calculate your BMI (body mass index). BMI is a measurement that tells if you are at a healthy weight. Waist circumference. This measures the distance around your waistline. This measurement also tells if you are at a healthy weight and may help predict your risk of  certain diseases, such as type 2 diabetes and high blood pressure. Heart rate and blood pressure. Body temperature. Skin for abnormal spots. What immunizations do I need?  Vaccines are usually given at various ages, according to a schedule. Your health care provider will recommend vaccines for you based on your age, medical history, and lifestyle or other factors, such as travel or where you work. What tests do I need? Screening Your health care provider may recommend screening tests for certain conditions. This may include: Lipid and cholesterol levels. Diabetes screening. This is done by checking your blood sugar (glucose) after you have not eaten for a while (fasting). Pelvic exam and Pap test. Hepatitis B test. Hepatitis C test. HIV (human immunodeficiency virus) test. STI (sexually transmitted infection) testing, if you are at risk. Lung cancer screening. Colorectal cancer screening. Mammogram. Talk with your health care provider about when you should start having regular mammograms. This may depend on whether you have a family history of breast cancer. BRCA-related cancer screening. This may be done if you have a family history of breast, ovarian, tubal, or peritoneal cancers. Bone density scan. This is done to screen for osteoporosis. Talk with your health care provider about your test results, treatment options, and if necessary, the need for more tests. Follow these instructions at home: Eating and drinking  Eat a diet that includes fresh fruits and vegetables, whole grains, lean protein, and low-fat dairy products. Take vitamin and mineral supplements as recommended by your health care provider. Do not drink alcohol if: Your health care provider tells you not to drink. You are pregnant, may be pregnant, or are planning to become pregnant. If you drink alcohol: Limit how much you have to 0-1 drink a day. Know how much alcohol is in your drink. In the [place] one drink equals  one 12 oz bottle of beer (355 mL), one 5 oz glass of wine (148 mL), or one 1 oz glass of hard liquor (44 mL). Lifestyle Brush your teeth every morning and night with fluoride toothpaste. Floss one time each day. Exercise for at least 30 minutes 5 or more days each week. Do not use any products that contain nicotine or tobacco. These products include cigarettes, chewing tobacco, and vaping devices, such as e-cigarettes. If you need help quitting, ask your health care provider. Do not use drugs. If you are sexually active, practice safe sex. Use a condom or other form of protection to prevent STIs. If you do not wish to become pregnant, use a form of birth control. If you plan to become pregnant, see your health care provider for a prepregnancy visit. Take aspirin only as told by your health care provider. Make sure that you understand how much to take and what form to take. Work with your health care provider to find out whether it is safe and beneficial for you to take aspirin daily. Find healthy ways to manage stress, such as: Meditation, yoga, or listening to music. Journaling. Talking to a trusted person. Spending time with friends and family. Minimize exposure to UV radiation to reduce your risk of skin cancer. Safety Always wear your seat belt while driving or riding in a vehicle. Do not drive: If you have  been drinking alcohol. Do not ride with someone who has been drinking. When you are tired or distracted. While texting. If you have been using any mind-altering substances or drugs. Wear a helmet and other protective equipment during sports activities. If you have firearms in your house, make sure you follow all gun safety procedures. Seek help if you have been physically or sexually abused. What's next? Visit your health care provider once a year for an annual wellness visit. Ask your health care provider how often you should have your eyes and teeth checked. Stay up to date on  all vaccines. This information is not intended to replace advice given to you by your health care provider. Make sure you discuss any questions you have with your health care provider. Document Revised: 10/26/2020 Document Reviewed: 10/26/2020 Elsevier Patient Education  2024 [employer].

## 2023-12-31 NOTE — Progress Notes (Addendum)
 Chief Complaint:  Sierra [name] is a 43 y.o. female who presents today for her annual comprehensive physical exam.    Assessment/Plan:  New/Acute Problems: Cough No red flags.  Overall reassuring exam.  Symptoms are improving.  Likely viral URI.  Anticipate symptoms will continue to improve over the next several days.  Will send prescription for Tessalon  as she has done well with this in the past.  She will let us  know if symptoms do not continue to improve.   Chronic Problems Addressed Today: Prediabetes Check.  Discussed lifestyle modifications.  Vitamin D  deficiency Check vitamin D .  Morbid obesity (HCC) Working with weight loss clinic.  Currently on Zepbound  and phentermine  though believes that her Zepbound  may not be approved by insurance going forward.  Preventative Healthcare: Check labs.  Will refer for colonoscopy.  Up-to-date on vaccines.  Patient Counseling(The following topics were reviewed and/or handout was given):  -Nutrition: Stressed importance of moderation in sodium/caffeine intake, saturated fat and cholesterol, caloric balance, sufficient intake of fresh fruits, vegetables, and fiber.  -Stressed the importance of regular exercise.   -Substance Abuse: Discussed cessation/primary prevention of tobacco, alcohol, or other drug use; driving or other dangerous activities under the influence; availability of treatment for abuse.   -Injury prevention: Discussed safety belts, safety helmets, smoke detector, smoking near bedding or upholstery.   -Sexuality: Discussed sexually transmitted diseases, partner selection, use of condoms, avoidance of unintended pregnancy and contraceptive alternatives.   -Dental health: Discussed importance of regular tooth brushing, flossing, and dental visits.  -Health maintenance and immunizations reviewed. Please refer to Health maintenance section.  Return to care in 1 year for next preventative visit.     Subjective:  HPI:  She has  no acute complaints today. Patient is here today for his annual physical.  See assessment / plan for status of chronic conditions.  Discussed the use of AI scribe software for clinical note transcription with the patient, who gave verbal consent to proceed.  History of Present Illness DESTANEY [name] is a 43 year old female who presents for an annual physical exam.  She developed a cough on Wednesday after exposure to coworkers who were coughing. By Friday, she experienced decreased energy and reduced appetite. She kept her niece on Friday and believes she transmitted the illness to her. By the following day, she felt better with reduced coughing, though she still experiences some mucus production and a slight headache. A COVID test on Sunday was negative. As of today, she feels much better. No sore throat or current issues with her ears.  Her blood sugar levels have been borderline in the past, and she acknowledges needing to work on her diet and exercise. She has been trying to cut down on [place] fries, which she identifies as a significant part of her diet. She had lost ten pounds but is concerned about her medication, Zepbound , as her coworkers received letters indicating their insurance might stop covering it. She has been experiencing delays in getting her medication authorized, although she has not lost much weight from it.  Her vision has been worsening, which she attributes to age and computer use. Her last eye exam suggested using over-the-counter glasses, and she has noticed an increase in her prescription strength from 1.25 to 1.75.  She reports that her cough tends to linger for a month or two whenever she catches a cold. She does find the 'little pearl things' effective for her cough. She experiences a cough that tends to  linger for a month or two after respiratory illnesses, which she finds annoying.        12/31/2023    8:06 AM  Depression screen PHQ 2/9  Decreased Interest 0   Down, Depressed, Hopeless 0  PHQ - 2 Score 0    Health Maintenance Due  Topic Date Due   Colonoscopy  Never done     ROS: Per HPI, otherwise a complete review of systems was negative.   PMH:  The following were reviewed and entered/updated in epic: Past Medical History:  Diagnosis Date   Family history of diabetes mellitus (DM)    History of vitamin D  deficiency    Prediabetes 03/09/2019   Prediabetes    Vitamin D  deficiency 03/09/2019   Patient Active Problem List   Diagnosis Date Noted   Well woman exam with routine gynecological exam 07/10/2023   Elevated blood pressure reading 06/11/2023   Morbid obesity (HCC) 11/13/2021   Prediabetes 03/09/2019   Vitamin D  deficiency 03/09/2019   History reviewed. No pertinent surgical history.  Family History  Problem Relation Age of Onset   Hypertension Mother    Diabetes Father        type 2   Cancer Maternal Grandmother 80       related to smoking, unsure type ? colon   Breast cancer Neg Hx     Medications- reviewed and updated Current Outpatient Medications  Medication Sig Dispense Refill   benzonatate  (TESSALON ) 200 MG capsule Take 1 capsule (200 mg total) by mouth 2 (two) times daily as needed for cough. 20 capsule 0   ergocalciferol  (VITAMIN D2) 1.25 MG (50000 UT) capsule Take 1 capsule (50,000 Units total) by mouth once a week. 4 capsule 5   metroNIDAZOLE  (METROGEL ) 0.75 % vaginal gel Place 1 Applicatorful vaginally 2 (two) times a week for 32 doses. Beginning one week after completing clindamycin  gel 320 g 0   norethindrone -ethinyl estradiol-FE (LOESTRIN FE) 1-20 MG-MCG tablet Take 1 tablet by mouth daily. 84 tablet 4   phentermine  (ADIPEX-P ) 37.5 MG tablet Take 1 tablet (37.5 mg total) by mouth daily. 30 tablet 0   tirzepatide  (ZEPBOUND ) 15 MG/0.5ML Pen Inject 15 mg into the skin once a week. 2 mL 0   tirzepatide  (ZEPBOUND ) 15 MG/0.5ML Pen Inject 15 mg into the skin once a week. 2 mL 0   tirzepatide  (ZEPBOUND ) 15  MG/0.5ML Pen Inject 15 mg into the skin once a week. 2 mL 0   No current facility-administered medications for this visit.    Allergies-reviewed and updated No Known Allergies  Social History   Socioeconomic History   Marital status: Single    Spouse name: Not on file   Number of children: 0   Years of education: Not on file   Highest education level: Some college, no degree  Occupational History    Employer: [employer]  Tobacco Use   Smoking status: Never   Smokeless tobacco: Never  Vaping Use   Vaping status: Never Used  Substance and Sexual Activity   Alcohol use: Yes    Alcohol/week: 3.0 standard drinks of alcohol    Types: 3 Glasses of wine per week    Comment: wine and occ mixed drink   Drug use: No   Sexual activity: Yes    Partners: Male    Birth control/protection: OCP  Other Topics Concern   Not on file  Social History Narrative   ** Merged History Encounter **  Social Drivers of Corporate investment banker Strain: Low Risk  (12/30/2023)   Overall Financial Resource Strain (CARDIA)    Difficulty of Paying Living Expenses: Not hard at all  Food Insecurity: No Food Insecurity (12/30/2023)   Hunger Vital Sign    Worried About Running Out of Food in the Last Year: Never true    Ran Out of Food in the Last Year: Never true  Transportation Needs: No Transportation Needs (12/30/2023)   PRAPARE - Administrator, Civil Service (Medical): No    Lack of Transportation (Non-Medical): No  Physical Activity: Insufficiently Active (12/30/2023)   Exercise Vital Sign    Days of Exercise per Week: 3 days    Minutes of Exercise per Session: 40 min  Stress: No Stress Concern Present (12/30/2023)   [employer] of Occupational Health - Occupational Stress Questionnaire    Feeling of Stress: Not at all  Social Connections: Moderately Isolated (12/30/2023)   Social Connection and Isolation Panel    Frequency of Communication with Friends and Family:  More than three times a week    Frequency of Social Gatherings with Friends and Family: Twice a week    Attends Religious Services: 1 to 4 times per year    Active Member of [employer] or Organizations: No    Attends Engineer, structural: Not on file    Marital Status: Never married        Objective:  Physical Exam: BP 120/80   Pulse 95   Temp (!) 97.5 F (36.4 C) (Temporal)   Ht 5' 11 (1.803 m)   Wt 269 lb (122 kg)   LMP 11/07/2023 (Approximate)   SpO2 97%   BMI 37.52 kg/m   Body mass index is 37.52 kg/m. Wt Readings from Last 3 Encounters:  12/31/23 269 lb (122 kg)  12/06/23 266 lb 6.4 oz (120.8 kg)  07/10/23 264 lb (119.7 kg)   Gen: NAD, resting comfortably HEENT: TMs normal bilaterally. OP clear. No thyromegaly noted.  CV: RRR with no murmurs appreciated Pulm: NWOB, CTAB with no crackles, wheezes, or rhonchi GI: Normal bowel sounds present. Soft, Nontender, Nondistended. MSK: no edema, cyanosis, or clubbing noted Skin: warm, dry Neuro: CN2-12 grossly intact. Strength 5/5 in upper and lower extremities. Reflexes symmetric and intact bilaterally.  Psych: Normal affect and thought content     Dahlia Nifong M. Kennyth, MD 12/31/2023 12:16 PM

## 2023-12-31 NOTE — Progress Notes (Signed)
 Her labs are all stable.  Her A1c is 6.1 which is a little higher than last year.  This is in the prediabetic range.  Do not need to start meds for this however she should continue to work on diet and exercise and we can recheck again in a year.

## 2023-12-31 NOTE — Assessment & Plan Note (Signed)
 Check vitamin D.

## 2023-12-31 NOTE — Assessment & Plan Note (Signed)
 Working with weight loss clinic.  Currently on Zepbound  and phentermine  though believes that her Zepbound  may not be approved by insurance going forward.

## 2024-01-01 ENCOUNTER — Other Ambulatory Visit (HOSPITAL_BASED_OUTPATIENT_CLINIC_OR_DEPARTMENT_OTHER): Payer: Self-pay

## 2024-01-03 ENCOUNTER — Other Ambulatory Visit (HOSPITAL_BASED_OUTPATIENT_CLINIC_OR_DEPARTMENT_OTHER): Payer: Self-pay

## 2024-01-04 ENCOUNTER — Other Ambulatory Visit (HOSPITAL_BASED_OUTPATIENT_CLINIC_OR_DEPARTMENT_OTHER): Payer: Self-pay

## 2024-01-09 ENCOUNTER — Other Ambulatory Visit (HOSPITAL_BASED_OUTPATIENT_CLINIC_OR_DEPARTMENT_OTHER): Payer: Self-pay

## 2024-01-15 ENCOUNTER — Other Ambulatory Visit (HOSPITAL_BASED_OUTPATIENT_CLINIC_OR_DEPARTMENT_OTHER): Payer: Self-pay

## 2024-01-15 MED ORDER — PHENTERMINE HCL 37.5 MG PO TABS
37.5000 mg | ORAL_TABLET | Freq: Every day | ORAL | 0 refills | Status: DC
  Filled 2024-01-15: qty 30, 30d supply, fill #0

## 2024-01-15 MED ORDER — WEGOVY 1 MG/0.5ML ~~LOC~~ SOAJ
1.0000 mg | SUBCUTANEOUS | 0 refills | Status: DC
  Filled 2024-01-15: qty 2, 28d supply, fill #0

## 2024-01-22 ENCOUNTER — Other Ambulatory Visit (HOSPITAL_BASED_OUTPATIENT_CLINIC_OR_DEPARTMENT_OTHER): Payer: Self-pay

## 2024-01-23 ENCOUNTER — Other Ambulatory Visit (HOSPITAL_BASED_OUTPATIENT_CLINIC_OR_DEPARTMENT_OTHER): Payer: Self-pay

## 2024-01-25 ENCOUNTER — Other Ambulatory Visit (HOSPITAL_BASED_OUTPATIENT_CLINIC_OR_DEPARTMENT_OTHER): Payer: Self-pay

## 2024-02-06 ENCOUNTER — Other Ambulatory Visit (HOSPITAL_BASED_OUTPATIENT_CLINIC_OR_DEPARTMENT_OTHER): Payer: Self-pay

## 2024-02-12 ENCOUNTER — Other Ambulatory Visit (HOSPITAL_BASED_OUTPATIENT_CLINIC_OR_DEPARTMENT_OTHER): Payer: Self-pay

## 2024-02-14 ENCOUNTER — Other Ambulatory Visit (HOSPITAL_BASED_OUTPATIENT_CLINIC_OR_DEPARTMENT_OTHER): Payer: Self-pay

## 2024-02-14 MED ORDER — WEGOVY 1 MG/0.5ML ~~LOC~~ SOAJ
1.0000 mg | SUBCUTANEOUS | 0 refills | Status: AC
Start: 2024-02-14 — End: ?
  Filled 2024-02-14: qty 2, 28d supply, fill #0

## 2024-02-14 MED ORDER — VITAMIN D (ERGOCALCIFEROL) 1.25 MG (50000 UNIT) PO CAPS
50000.0000 [IU] | ORAL_CAPSULE | ORAL | 5 refills | Status: AC
  Filled 2024-02-14: qty 4, 28d supply, fill #0
  Filled 2024-03-26: qty 4, 28d supply, fill #1

## 2024-02-14 MED ORDER — PHENTERMINE HCL 37.5 MG PO TABS: 37.5000 mg | ORAL_TABLET | Freq: Every day | ORAL | 0 refills | Status: AC

## 2024-03-04 ENCOUNTER — Encounter: Payer: Self-pay | Admitting: Gastroenterology

## 2024-03-05 ENCOUNTER — Other Ambulatory Visit (HOSPITAL_BASED_OUTPATIENT_CLINIC_OR_DEPARTMENT_OTHER): Payer: Self-pay

## 2024-03-17 ENCOUNTER — Other Ambulatory Visit (HOSPITAL_BASED_OUTPATIENT_CLINIC_OR_DEPARTMENT_OTHER): Payer: Self-pay

## 2024-03-17 MED ORDER — WEGOVY 1.7 MG/0.75ML ~~LOC~~ SOAJ
1.7000 mg | SUBCUTANEOUS | 0 refills | Status: AC
  Filled 2024-03-17: qty 3, 28d supply, fill #0

## 2024-03-24 ENCOUNTER — Ambulatory Visit (AMBULATORY_SURGERY_CENTER)

## 2024-03-24 ENCOUNTER — Other Ambulatory Visit (HOSPITAL_BASED_OUTPATIENT_CLINIC_OR_DEPARTMENT_OTHER): Payer: Self-pay

## 2024-03-24 VITALS — Ht 71.5 in | Wt 268.0 lb

## 2024-03-24 DIAGNOSIS — Z1211 Encounter for screening for malignant neoplasm of colon: Secondary | ICD-10-CM

## 2024-03-24 MED ORDER — NA SULFATE-K SULFATE-MG SULF 17.5-3.13-1.6 GM/177ML PO SOLN: 1.0000 | Freq: Once | ORAL | 0 refills | Status: AC

## 2024-03-24 NOTE — Progress Notes (Signed)

## 2024-03-31 ENCOUNTER — Encounter: Payer: Self-pay | Admitting: Gastroenterology

## 2024-04-01 ENCOUNTER — Encounter: Payer: Self-pay | Admitting: Gastroenterology

## 2024-04-07 ENCOUNTER — Ambulatory Visit (AMBULATORY_SURGERY_CENTER): Admitting: Gastroenterology

## 2024-04-07 ENCOUNTER — Encounter: Payer: Self-pay | Admitting: Gastroenterology

## 2024-04-07 VITALS — BP 127/85 | HR 80 | Temp 97.6°F | Resp 21 | Ht 71.0 in | Wt 268.0 lb

## 2024-04-07 DIAGNOSIS — Z1211 Encounter for screening for malignant neoplasm of colon: Secondary | ICD-10-CM | POA: Diagnosis present

## 2024-04-07 MED ORDER — SODIUM CHLORIDE 0.9 % IV SOLN: 500.0000 mL | Freq: Once | INTRAVENOUS | Status: DC

## 2024-04-07 NOTE — Progress Notes (Signed)
 Report given to PACU, vss

## 2024-04-07 NOTE — Progress Notes (Signed)
 Pt's states no medical or surgical changes since previsit or office visit.

## 2024-04-07 NOTE — Op Note (Signed)
  [facility] Patient Name: Sierra [name] Procedure Date: 04/07/2024 8:29 AM MRN: [id number] Endoscopist: Elspeth P. Leigh , MD, [id number] Age: 43 Referring MD:  Date of Birth: [birth year] Gender: Female Account #: [id number] Procedure:                Colonoscopy Indications:              Screening for colorectal malignant neoplasm, This                            is the patient's first colonoscopy Medicines:                Monitored Anesthesia Care Procedure:                Pre-Anesthesia Assessment:                           - Prior to the procedure, a History and Physical                            was performed, and patient medications and                            allergies were reviewed. The patient's tolerance of                            previous anesthesia was also reviewed. The risks                            and benefits of the procedure and the sedation                            options and risks were discussed with the patient.                            All questions were answered, and informed consent                            was obtained. Prior Anticoagulants: The patient has                            taken no anticoagulant or antiplatelet agents. ASA                            Grade Assessment: II - A patient with mild systemic                            disease. After reviewing the risks and benefits,                            the patient was deemed in satisfactory condition to                            undergo the procedure.  After obtaining informed consent, the colonoscope                            was passed under direct vision. Throughout the                            procedure, the patient's blood pressure, pulse, and                            oxygen saturations were monitored continuously. The                            Olympus Scope SN [id number] was introduced through the                            anus and  advanced to the the cecum, identified by                            appendiceal orifice and ileocecal valve. The                            colonoscopy was performed without difficulty. The                            patient tolerated the procedure well. The quality                            of the bowel preparation was excellent. The                            ileocecal valve, appendiceal orifice, and rectum                            were photographed. Scope In: 8:42:36 AM Scope Out: 8:54:58 AM Scope Withdrawal Time: 0 hours 10 minutes 19 seconds  Total Procedure Duration: 0 hours 12 minutes 22 seconds  Findings:                 The perianal and digital rectal examinations were                            normal.                           The entire examined colon appeared normal on direct                            and retroflexion views. Complications:            No immediate complications. Estimated blood loss:                            None. Estimated Blood Loss:     Estimated blood loss: none. Impression:               - The entire examined colon is normal on direct  and                            retroflexion views.                           - No polyps. Recommendation:           - Patient has a contact number available for                            emergencies. The signs and symptoms of potential                            delayed complications were discussed with the                            patient. Return to normal activities tomorrow.                            Written discharge instructions were provided to the                            patient.                           - Resume previous diet.                           - Continue present medications.                           - Repeat colonoscopy in 10 years for screening                            purposes. Elspeth P. Shyah Cadmus, MD 04/07/2024 8:58:15 AM This report has been signed electronically.

## 2024-04-07 NOTE — Progress Notes (Signed)
 [facility] History and Physical   Primary Care Physician:  Kennyth Worth HERO, MD   Reason for Procedure:   Colon cancer screening  Plan:    colonoscopy     HPI: Sierra [name] is a 43 y.o. female  here for colonoscopy screening - first time exam.   Patient denies any bowel symptoms at this time. No family history of colon cancer known. Otherwise feels well without any cardiopulmonary symptoms.   I have discussed risks / benefits of anesthesia and endoscopic procedure with Alejo LOISE Edison and they wish to proceed with the exams as outlined today.   The patient was provided an opportunity to ask questions and all were answered. The patient agreed with the plan.    Past Medical History:  Diagnosis Date   Family history of diabetes mellitus (DM)    History of vitamin D  deficiency    Prediabetes 03/09/2019   Vitamin D  deficiency 03/09/2019    History reviewed. No pertinent surgical history.  Prior to Admission medications   Medication Sig Start Date End Date Taking? Authorizing Provider  ergocalciferol  (VITAMIN D2) 1.25 MG (50000 UT) capsule Take 1 capsule (50,000 Units total) by mouth once a week. 10/16/23  Yes   Vitamin D , Ergocalciferol , (DRISDOL ) 1.25 MG (50000 UNIT) CAPS capsule Take 1 capsule (50,000 Units total) by mouth once a week. 02/14/24  Yes   norethindrone -ethinyl estradiol-FE (LOESTRIN FE) 1-20 MG-MCG tablet Take 1 tablet by mouth daily. 07/10/23   Hines, Vera GAILS, MD  phentermine  (ADIPEX-P ) 37.5 MG tablet Take 1 tablet (37.5 mg total) by mouth daily. Patient not taking: No sig reported 02/14/24     semaglutide -weight management (WEGOVY ) 1 MG/0.5ML SOAJ SQ injection Inject 1 mg into the skin once a week. 02/14/24     semaglutide -weight management (WEGOVY ) 1.7 MG/0.75ML SOAJ SQ injection Inject 1.7 mg into the skin once a week. 03/17/24     tirzepatide  (ZEPBOUND ) 15 MG/0.5ML Pen Inject 15 mg into the skin once a week. 11/14/23     tirzepatide  (ZEPBOUND ) 15 MG/0.5ML  Pen Inject 15 mg into the skin once a week. 12/13/23     tirzepatide  (ZEPBOUND ) 15 MG/0.5ML Pen Inject 15 mg into the skin once a week. 12/13/23       Current Outpatient Medications  Medication Sig Dispense Refill   ergocalciferol  (VITAMIN D2) 1.25 MG (50000 UT) capsule Take 1 capsule (50,000 Units total) by mouth once a week. 4 capsule 5   Vitamin D , Ergocalciferol , (DRISDOL ) 1.25 MG (50000 UNIT) CAPS capsule Take 1 capsule (50,000 Units total) by mouth once a week. 4 capsule 5   norethindrone -ethinyl estradiol-FE (LOESTRIN FE) 1-20 MG-MCG tablet Take 1 tablet by mouth daily. 84 tablet 4   phentermine  (ADIPEX-P ) 37.5 MG tablet Take 1 tablet (37.5 mg total) by mouth daily. (Patient not taking: No sig reported) 30 tablet 0   semaglutide -weight management (WEGOVY ) 1 MG/0.5ML SOAJ SQ injection Inject 1 mg into the skin once a week. 2 mL 0   semaglutide -weight management (WEGOVY ) 1.7 MG/0.75ML SOAJ SQ injection Inject 1.7 mg into the skin once a week. 3 mL 0   tirzepatide  (ZEPBOUND ) 15 MG/0.5ML Pen Inject 15 mg into the skin once a week. 2 mL 0   tirzepatide  (ZEPBOUND ) 15 MG/0.5ML Pen Inject 15 mg into the skin once a week. 2 mL 0   tirzepatide  (ZEPBOUND ) 15 MG/0.5ML Pen Inject 15 mg into the skin once a week. 2 mL 0   Current Facility-Administered Medications  Medication Dose Route Frequency  Provider Last Rate Last Admin   0.9 %  sodium chloride  infusion  500 mL Intravenous Once Sylvia Helms, Elspeth SQUIBB, MD        Allergies as of 04/07/2024   (No Known Allergies)    Family History  Problem Relation Age of Onset   Hypertension Mother    Diabetes Father        type 2   Cancer Maternal Grandmother 70       related to smoking, unsure type ? colon   Breast cancer Neg Hx    Colon cancer Neg Hx    Colon polyps Neg Hx    Esophageal cancer Neg Hx    Rectal cancer Neg Hx    Stomach cancer Neg Hx     Social History   Socioeconomic History   Marital status: Single    Spouse name: Not on file    Number of children: 0   Years of education: Not on file   Highest education level: Some college, no degree  Occupational History    Employer: [employer]  Tobacco Use   Smoking status: Never   Smokeless tobacco: Never  Vaping Use   Vaping status: Never Used  Substance and Sexual Activity   Alcohol use: Yes    Alcohol/week: 3.0 standard drinks of alcohol    Types: 3 Glasses of wine per week    Comment: wine and occ mixed drink   Drug use: No   Sexual activity: Yes    Partners: Male    Birth control/protection: OCP  Other Topics Concern   Not on file  Social History Narrative   ** Merged History Encounter **       Social Drivers of Health   Financial Resource Strain: Low Risk  (12/30/2023)   Overall Financial Resource Strain (CARDIA)    Difficulty of Paying Living Expenses: Not hard at all  Food Insecurity: No Food Insecurity (12/30/2023)   Hunger Vital Sign    Worried About Running Out of Food in the Last Year: Never true    Ran Out of Food in the Last Year: Never true  Transportation Needs: No Transportation Needs (12/30/2023)   PRAPARE - Administrator, Civil Service (Medical): No    Lack of Transportation (Non-Medical): No  Physical Activity: Insufficiently Active (12/30/2023)   Exercise Vital Sign    Days of Exercise per Week: 3 days    Minutes of Exercise per Session: 40 min  Stress: No Stress Concern Present (12/30/2023)   [employer] of Occupational Health - Occupational Stress Questionnaire    Feeling of Stress: Not at all  Social Connections: Moderately Isolated (12/30/2023)   Social Connection and Isolation Panel    Frequency of Communication with Friends and Family: More than three times a week    Frequency of Social Gatherings with Friends and Family: Twice a week    Attends Religious Services: 1 to 4 times per year    Active Member of [employer] or Organizations: No    Attends Engineer, Structural: Not on file    Marital Status: Never  married  Intimate Partner Violence: Not on file    Review of Systems: All other review of systems negative except as mentioned in the HPI.  Physical Exam: Vital signs BP 132/80   Pulse 91   Temp 97.6 F (36.4 C)   Ht 5' 11 (1.803 m)   Wt 268 lb (121.6 kg)   SpO2 98%   BMI 37.38 kg/m  General:   Alert,  Well-developed, pleasant and cooperative in NAD Lungs:  Clear throughout to auscultation.   Heart:  Regular rate and rhythm Abdomen:  Soft, nontender and nondistended.   Neuro/Psych:  Alert and cooperative. Normal mood and affect. A and O x 3  Marcey Naval, MD [facility]

## 2024-04-07 NOTE — Patient Instructions (Signed)
  Resume previous diet  Continue present medications  Repeat colonoscopy in 10 years for screening purposes   YOU HAD AN ENDOSCOPIC PROCEDURE TODAY AT THE [facility]:   Refer to the procedure report that was given to you for any specific questions about what was found during the examination.  If the procedure report does not answer your questions, please call your gastroenterologist to clarify.  If you requested that your care partner not be given the details of your procedure findings, then the procedure report has been included in a sealed envelope for you to review at your convenience later.  YOU SHOULD EXPECT: Some feelings of bloating in the abdomen. Passage of more gas than usual.  Walking can help get rid of the air that was put into your GI tract during the procedure and reduce the bloating. If you had a lower endoscopy (such as a colonoscopy or flexible sigmoidoscopy) you may notice spotting of blood in your stool or on the toilet paper. If you underwent a bowel prep for your procedure, you may not have a normal bowel movement for a few days.  Please Note:  You might notice some irritation and congestion in your nose or some drainage.  This is from the oxygen used during your procedure.  There is no need for concern and it should clear up in a day or so.  SYMPTOMS TO REPORT IMMEDIATELY:  Following lower endoscopy (colonoscopy or flexible sigmoidoscopy):  Excessive amounts of blood in the stool  Significant tenderness or worsening of abdominal pains  Swelling of the abdomen that is new, acute  Fever of 100F or higher  For urgent or emergent issues, a gastroenterologist can be reached at any hour by calling [phone]. Do not use MyChart messaging for urgent concerns.    DIET:  We do recommend a small meal at first, but then you may proceed to your regular diet.  Drink plenty of fluids but you should avoid alcoholic beverages for 24 hours.  ACTIVITY:  You should  plan to take it easy for the rest of today and you should NOT DRIVE or use heavy machinery until tomorrow (because of the sedation medicines used during the test).    FOLLOW UP: Our staff will call the number listed on your records the next business day following your procedure.  We will call around 7:15- 8:00 am to check on you and address any questions or concerns that you may have regarding the information given to you following your procedure. If we do not reach you, we will leave a message.     If any biopsies were taken you will be contacted by phone or by letter within the next 1-3 weeks.  Please call us at [phone] if you have not heard about the biopsies in 3 weeks.    SIGNATURES/CONFIDENTIALITY: You and/or your care partner have signed paperwork which will be entered into your electronic medical record.  These signatures attest to the fact that that the information above on your After Visit Summary has been reviewed and is understood.  Full responsibility of the confidentiality of this discharge information lies with you and/or your care-partner.

## 2024-04-08 ENCOUNTER — Telehealth: Payer: Self-pay

## 2024-04-08 NOTE — Telephone Encounter (Signed)
  Follow up Call-     04/07/2024    7:53 AM  Call back number  Post procedure Call Back phone  # [phone]  Permission to leave phone message Yes     Patient questions:  Do you have a fever, pain , or abdominal swelling? No. Pain Score  0 *  Have you tolerated food without any problems? Yes.    Have you been able to return to your normal activities? Yes.    Do you have any questions about your discharge instructions: Diet   No. Medications  No. Follow up visit  No.  Do you have questions or concerns about your Care? No.  Actions: * If pain score is 4 or above: No action needed, pain <4.

## 2024-04-29 ENCOUNTER — Telehealth: Admitting: Physician Assistant

## 2024-04-29 DIAGNOSIS — J208 Acute bronchitis due to other specified organisms: Secondary | ICD-10-CM | POA: Diagnosis not present

## 2024-04-29 MED ORDER — PREDNISONE 20 MG PO TABS: 40.0000 mg | ORAL_TABLET | Freq: Every day | ORAL | 0 refills | Status: AC

## 2024-04-29 MED ORDER — PSEUDOEPH-BROMPHEN-DM 30-2-10 MG/5ML PO SYRP: 5.0000 mL | ORAL_SOLUTION | Freq: Four times a day (QID) | ORAL | 0 refills | Status: AC | PRN

## 2024-04-29 NOTE — Progress Notes (Signed)
 We are sorry that you are not feeling well.  Here is how we plan to help!  Based on your presentation I believe you most likely have A cough due to a virus.  This is called viral bronchitis and is best treated by rest, plenty of fluids and control of the cough.  You may use Ibuprofen  or Tylenol as directed to help your symptoms.     In addition you may use a prescription cough syrup called Bromfed DM Take 5mL every 6 hours as needed for cough and congestion.   I am prescribing Prednisone  20mg  Take 2 tablets (40mg ) daily for 5 days.   From your responses in the eVisit questionnaire you describe inflammation in the upper respiratory tract which is causing a significant cough.  This is commonly called Bronchitis and has four common causes:   Allergies Viral Infections Acid Reflux Bacterial Infection Allergies, viruses and acid reflux are treated by controlling symptoms or eliminating the cause. An example might be a cough caused by taking certain blood pressure medications. You stop the cough by changing the medication. Another example might be a cough caused by acid reflux. Controlling the reflux helps control the cough.  USE OF BRONCHODILATOR (RESCUE) INHALERS: There is a risk from using your bronchodilator too frequently.  The risk is that over-reliance on a medication which only relaxes the muscles surrounding the breathing tubes can reduce the effectiveness of medications prescribed to reduce swelling and congestion of the tubes themselves.  Although you feel brief relief from the bronchodilator inhaler, your asthma may actually be worsening with the tubes becoming more swollen and filled with mucus.  This can delay other crucial treatments, such as oral steroid medications. If you need to use a bronchodilator inhaler daily, several times per day, you should discuss this with your provider.  There are probably better treatments that could be used to keep your asthma under control.     HOME  CARE Only take medications as instructed by your medical team. Complete the entire course of an antibiotic. Drink plenty of fluids and get plenty of rest. Avoid close contacts especially the very young and the elderly Cover your mouth if you cough or cough into your sleeve. Always remember to wash your hands A steam or ultrasonic humidifier can help congestion.   GET HELP RIGHT AWAY IF: You develop worsening fever. You become short of breath You cough up blood. Your symptoms persist after you have completed your treatment plan MAKE SURE YOU  Understand these instructions. Will watch your condition. Will get help right away if you are not doing well or get worse.  Your e-visit answers were reviewed by a board certified advanced clinical practitioner to complete your personal care plan.  Depending on the condition, your plan could have included both over the counter or prescription medications. If there is a problem please reply  once you have received a response from your provider. Your safety is important to us .  If you have drug allergies check your prescription carefully.    You can use MyChart to ask questions about todays visit, request a non-urgent call back, or ask for a work or school excuse for 24 hours related to this e-Visit. If it has been greater than 24 hours you will need to follow up with your provider, or enter a new e-Visit to address those concerns. You will get an e-mail in the next two days asking about your experience.  I hope that your e-visit has been  valuable and will speed your recovery. Thank you for using e-visits.   I have spent 5 minutes in review of e-visit questionnaire, review and updating patient chart, medical decision making and response to patient.   Delon CHRISTELLA Dickinson, PA-C

## 2024-05-19 ENCOUNTER — Other Ambulatory Visit: Payer: Self-pay | Admitting: Obstetrics and Gynecology

## 2024-05-19 DIAGNOSIS — Z1231 Encounter for screening mammogram for malignant neoplasm of breast: Secondary | ICD-10-CM

## 2024-07-02 ENCOUNTER — Ambulatory Visit

## 2024-07-10 ENCOUNTER — Ambulatory Visit: Payer: BC Managed Care – PPO | Admitting: Obstetrics and Gynecology

## 2025-01-01 ENCOUNTER — Encounter: Admitting: Family Medicine
